# Patient Record
Sex: Male | Born: 1985 | Hispanic: No | Marital: Single | Smoking: Current some day smoker
Health system: Southern US, Community
[De-identification: ages and names within clinical notes are randomized; demographics above are authoritative.]

## PROBLEM LIST (undated history)

## (undated) DIAGNOSIS — E782 Mixed hyperlipidemia: Secondary | ICD-10-CM

## (undated) DIAGNOSIS — R109 Unspecified abdominal pain: Secondary | ICD-10-CM

## (undated) DIAGNOSIS — S83249A Other tear of medial meniscus, current injury, unspecified knee, initial encounter: Secondary | ICD-10-CM

## (undated) DIAGNOSIS — E119 Type 2 diabetes mellitus without complications: Secondary | ICD-10-CM

## (undated) HISTORY — DX: Type 2 diabetes mellitus without complications: E11.9

## (undated) HISTORY — DX: Unspecified abdominal pain: R10.9

## (undated) HISTORY — DX: Mixed hyperlipidemia: E78.2

---

## 2010-08-15 ENCOUNTER — Emergency Department (HOSPITAL_COMMUNITY): Admission: EM | Admit: 2010-08-15 | Discharge: 2010-08-16 | Payer: Self-pay | Admitting: Emergency Medicine

## 2010-08-17 ENCOUNTER — Emergency Department (HOSPITAL_COMMUNITY): Admission: EM | Admit: 2010-08-17 | Discharge: 2010-08-17 | Payer: Self-pay | Admitting: Emergency Medicine

## 2010-10-19 ENCOUNTER — Encounter
Admission: RE | Admit: 2010-10-19 | Discharge: 2010-12-14 | Payer: Self-pay | Source: Home / Self Care | Attending: Orthopedic Surgery | Admitting: Orthopedic Surgery

## 2011-03-12 LAB — COMPREHENSIVE METABOLIC PANEL
ALT: 35 U/L (ref 0–53)
Albumin: 3.9 g/dL (ref 3.5–5.2)
Alkaline Phosphatase: 97 U/L (ref 39–117)
BUN: 14 mg/dL (ref 6–23)
CO2: 22 mEq/L (ref 19–32)
Calcium: 8.2 mg/dL — ABNORMAL LOW (ref 8.4–10.5)
GFR calc Af Amer: >60 mL/min (ref >60)
GFR calc non Af Amer: >60 mL/min (ref >60)
Potassium: 3.5 mEq/L (ref 3.5–5.1)

## 2011-03-12 LAB — CBC
Hemoglobin: 14.9 g/dL (ref 13.0–17.0)
MCH: 30.5 pg (ref 26.0–34.0)
RBC: 4.89 MIL/uL (ref 4.22–5.81)
RDW: 13.5 % (ref 11.5–15.5)
WBC: 8.4 10*3/uL (ref 4.0–10.5)

## 2011-03-12 LAB — POCT I-STAT, CHEM 8
Creatinine, Ser: 0.8 mg/dL (ref 0.4–1.5)
Potassium: 3.5 mEq/L (ref 3.5–5.1)
Sodium: 141 mEq/L (ref 135–145)
TCO2: 23 mmol/L (ref 0–100)

## 2011-03-12 LAB — PROTIME-INR: Prothrombin Time: 13.1 seconds (ref 11.6–15.2)

## 2011-03-12 LAB — LACTIC ACID, PLASMA: Lactic Acid, Venous: 2.4 mmol/L — ABNORMAL HIGH (ref 0.5–2.2)

## 2011-03-12 LAB — APTT: aPTT: 27 seconds (ref 24–37)

## 2012-04-11 ENCOUNTER — Other Ambulatory Visit: Payer: Self-pay | Admitting: Orthopedic Surgery

## 2012-04-11 DIAGNOSIS — M25531 Pain in right wrist: Secondary | ICD-10-CM

## 2012-04-17 ENCOUNTER — Ambulatory Visit
Admission: RE | Admit: 2012-04-17 | Discharge: 2012-04-17 | Disposition: A | Discharge status: Home / Self Care | Payer: Self-pay | Source: Ambulatory Visit | Attending: Orthopedic Surgery | Admitting: Orthopedic Surgery

## 2012-04-17 DIAGNOSIS — M25531 Pain in right wrist: Secondary | ICD-10-CM

## 2012-04-17 MED ORDER — IOHEXOL 180 MG/ML  SOLN
3.0000 mL | Freq: Once | INTRAMUSCULAR | Status: AC | PRN
  Administered 2012-04-17: 3 mL via INTRA_ARTICULAR

## 2016-11-13 ENCOUNTER — Emergency Department (HOSPITAL_COMMUNITY): Payer: Medicaid Other

## 2016-11-13 ENCOUNTER — Observation Stay (HOSPITAL_COMMUNITY)
Admission: EM | Admit: 2016-11-13 | Discharge: 2016-11-15 | Disposition: A | Discharge status: Home / Self Care | Payer: Medicaid Other | Attending: Orthopaedic Surgery | Admitting: Orthopaedic Surgery

## 2016-11-13 ENCOUNTER — Encounter (HOSPITAL_COMMUNITY): Payer: Self-pay | Admitting: *Deleted

## 2016-11-13 DIAGNOSIS — K353 Acute appendicitis with localized peritonitis, without perforation or gangrene: Secondary | ICD-10-CM

## 2016-11-13 DIAGNOSIS — F17200 Nicotine dependence, unspecified, uncomplicated: Secondary | ICD-10-CM | POA: Insufficient documentation

## 2016-11-13 DIAGNOSIS — K37 Unspecified appendicitis: Secondary | ICD-10-CM | POA: Diagnosis present

## 2016-11-13 LAB — COMPREHENSIVE METABOLIC PANEL
ALT: 86 U/L — ABNORMAL HIGH (ref 17–63)
ANION GAP: 10 (ref 5–15)
AST: 49 U/L — ABNORMAL HIGH (ref 15–41)
Albumin: 4.9 g/dL (ref 3.5–5.0)
Alkaline Phosphatase: 65 U/L (ref 38–126)
BILIRUBIN TOTAL: 0.5 mg/dL (ref 0.3–1.2)
BUN: 15 mg/dL (ref 6–20)
CHLORIDE: 100 mmol/L — AB (ref 101–111)
CO2: 27 mmol/L (ref 22–32)
Calcium: 9.7 mg/dL (ref 8.9–10.3)
Creatinine, Ser: 0.9 mg/dL (ref 0.61–1.24)
Glucose, Bld: 133 mg/dL — ABNORMAL HIGH (ref 65–99)
POTASSIUM: 3.5 mmol/L (ref 3.5–5.1)
Sodium: 137 mmol/L (ref 135–145)
TOTAL PROTEIN: 7.9 g/dL (ref 6.5–8.1)

## 2016-11-13 LAB — CBC
HEMATOCRIT: 43.8 % (ref 39.0–52.0)
Hemoglobin: 15.6 g/dL (ref 13.0–17.0)
MCH: 29.8 pg (ref 26.0–34.0)
MCHC: 35.6 g/dL (ref 30.0–36.0)
MCV: 83.6 fL (ref 78.0–100.0)
Platelets: 305 10*3/uL (ref 150–400)
RBC: 5.24 MIL/uL (ref 4.22–5.81)
RDW: 13.3 % (ref 11.5–15.5)
WBC: 16.6 10*3/uL — AB (ref 4.0–10.5)

## 2016-11-13 LAB — URINALYSIS, ROUTINE W REFLEX MICROSCOPIC
Bilirubin Urine: NEGATIVE
GLUCOSE, UA: NEGATIVE mg/dL
Hgb urine dipstick: NEGATIVE
KETONES UR: NEGATIVE mg/dL
LEUKOCYTES UA: NEGATIVE
NITRITE: NEGATIVE
PH: 7 (ref 5.0–8.0)
PROTEIN: NEGATIVE mg/dL
Specific Gravity, Urine: 1.028 (ref 1.005–1.030)

## 2016-11-13 LAB — LIPASE, BLOOD: LIPASE: 22 U/L (ref 11–51)

## 2016-11-13 MED ORDER — ONDANSETRON HCL 4 MG/2ML IJ SOLN
4.0000 mg | Freq: Once | INTRAMUSCULAR | Status: AC
  Administered 2016-11-13: 4 mg via INTRAVENOUS
  Filled 2016-11-13: qty 2

## 2016-11-13 MED ORDER — HYDROMORPHONE HCL 2 MG/ML IJ SOLN
1.0000 mg | Freq: Once | INTRAMUSCULAR | Status: AC
  Administered 2016-11-13: 1 mg via INTRAVENOUS
  Filled 2016-11-13: qty 1

## 2016-11-13 MED ORDER — OXYCODONE-ACETAMINOPHEN 5-325 MG PO TABS
1.0000 | ORAL_TABLET | Freq: Once | ORAL | Status: AC
Start: 2016-11-13 — End: 2016-11-13
  Administered 2016-11-13: 1 via ORAL
  Filled 2016-11-13: qty 1

## 2016-11-13 MED ORDER — ONDANSETRON 4 MG PO TBDP
8.0000 mg | ORAL_TABLET | Freq: Once | ORAL | Status: AC
  Administered 2016-11-13: 8 mg via ORAL
  Filled 2016-11-13: qty 2

## 2016-11-13 MED ORDER — IOPAMIDOL (ISOVUE-300) INJECTION 61%
INTRAVENOUS | Status: AC
  Administered 2016-11-13: 100 mL
  Filled 2016-11-13: qty 100

## 2016-11-13 NOTE — ED Notes (Signed)
Patient transported to CT 

## 2016-11-13 NOTE — ED Triage Notes (Signed)
The pt has had abd pain since 1300 today with n and v  No blood in ujrine  He speaks only a little  English  No aocohol consumption

## 2016-11-13 NOTE — ED Provider Notes (Signed)
[facility]-EMERGENCY DEPT Provider Note   CSN: [id number] Arrival date & time: 11/13/16  1756     History   Chief Complaint Chief Complaint  Patient presents with  . Abdominal Pain    HPI William Sutton is a 30 y.o. male.  Patient presents to the emergency department with chief complaint of abdominal pain. He states that the pain started in his right lower quadrant earlier today and has spread to his entire abdomen. He reports associated nausea, vomiting, and the feeling like he needs to use the bathroom. He denies any dysuria or hematuria. Denies any upper abdominal pain, recent alcohol use, or prior abdominal surgeries. He denies any fevers or chills. He was given oxycodone in triage, but otherwise has not taken anything for symptoms. Symptoms are worsened with movement and palpation of the abdomen.   The history is provided by the patient. No language interpreter was used.    History reviewed. No pertinent past medical history.  There are no active problems to display for this patient.   History reviewed. No pertinent surgical history.     Home Medications    Prior to Admission medications   Not on File    Family History No family history on file.  Social History Social History  Substance Use Topics  . Smoking status: Current Every Day Smoker  . Smokeless tobacco: Never Used  . Alcohol use Yes     Allergies   Patient has no known allergies.   Review of Systems Review of Systems  Gastrointestinal: Positive for abdominal pain, nausea and vomiting.  All other systems reviewed and are negative.    Physical Exam Updated Vital Signs BP 126/95 (BP Location: Right Arm)   Pulse 76   Temp 97.7 F (36.5 C) (Oral)   Resp 18   Ht 5\' 2"  (1.575 m)   Wt 70.3 kg   SpO2 100%   BMI 28.35 kg/m   Physical Exam  Constitutional: He is oriented to person, place, and time. He appears well-developed and well-nourished.  HENT:  Head: Normocephalic and atraumatic.   Eyes: Conjunctivae and EOM are normal. Pupils are equal, round, and reactive to light. Right eye exhibits no discharge. Left eye exhibits no discharge. No scleral icterus.  Neck: Normal range of motion. Neck supple. No JVD present.  Cardiovascular: Normal rate, regular rhythm and normal heart sounds.  Exam reveals no gallop and no friction rub.   No murmur heard. Pulmonary/Chest: Effort normal and breath sounds normal. No respiratory distress. He has no wheezes. He has no rales. He exhibits no tenderness.  Abdominal: Soft. He exhibits no distension and no mass. There is tenderness. There is no rebound and no guarding.  Tenderness to palpation of the right lower quadrant  Musculoskeletal: Normal range of motion. He exhibits no edema or tenderness.  Neurological: He is alert and oriented to person, place, and time.  Skin: Skin is warm and dry.  Psychiatric: He has a normal mood and affect. His behavior is normal. Judgment and thought content normal.  Nursing note and vitals reviewed.    ED Treatments / Results  Labs (all labs ordered are listed, but only abnormal results are displayed) Labs Reviewed  COMPREHENSIVE METABOLIC PANEL - Abnormal; Notable for the following:       Result Value   Chloride 100 (*)    Glucose, Bld 133 (*)    AST 49 (*)    ALT 86 (*)    All other components within normal limits  CBC -  Abnormal; Notable for the following:    WBC 16.6 (*)    All other components within normal limits  LIPASE, BLOOD  URINALYSIS, ROUTINE W REFLEX MICROSCOPIC (NOT AT [facility])    EKG  EKG Interpretation None       Radiology No results found.  Procedures Procedures (including critical care time)  Medications Ordered in ED Medications  HYDROmorphone (DILAUDID) injection 1 mg (not administered)  ondansetron (ZOFRAN) injection 4 mg (not administered)  ondansetron (ZOFRAN-ODT) disintegrating tablet 8 mg (8 mg Oral Given 11/13/16 1843)  oxyCODONE-acetaminophen  (PERCOCET/ROXICET) 5-325 MG per tablet 1 tablet (1 tablet Oral Given 11/13/16 1844)     Initial Impression / Assessment and Plan / ED Course  I have reviewed the triage vital signs and the nursing notes.  Pertinent labs & imaging results that were available during my care of the patient were reviewed by me and considered in my medical decision making (see chart for details).  Clinical Course     Right lower quadrant abdominal pain with nausea and elevated white blood cell count. Will check CT abdomen. We'll treat pain. Will reassess.  CT is consistent with appendicitis given the patient's acute right lower quadrant symptoms.  Appreciate Dr. Dwain SarnaWakefield for consulting on the patient.  Will also consult social work regarding financial assistance for the patient.  Final Clinical Impressions(s) / ED Diagnoses   Final diagnoses:  Acute appendicitis with localized peritonitis    New Prescriptions New Prescriptions   No medications on file     Roxy HorsemanRobert Solymar Grace, PA-C 11/13/16 2307    Courteney Lyn Mackuen, MD 11/13/16 2333

## 2016-11-14 ENCOUNTER — Observation Stay (HOSPITAL_COMMUNITY): Payer: Medicaid Other | Admitting: Certified Registered Nurse Anesthetist

## 2016-11-14 ENCOUNTER — Encounter (HOSPITAL_COMMUNITY)
Admission: EM | Disposition: A | Discharge status: Home / Self Care | Payer: Self-pay | Source: Home / Self Care | Attending: Physician Assistant

## 2016-11-14 DIAGNOSIS — F17200 Nicotine dependence, unspecified, uncomplicated: Secondary | ICD-10-CM | POA: Diagnosis not present

## 2016-11-14 DIAGNOSIS — K37 Unspecified appendicitis: Secondary | ICD-10-CM | POA: Diagnosis present

## 2016-11-14 DIAGNOSIS — K353 Acute appendicitis with localized peritonitis: Secondary | ICD-10-CM | POA: Diagnosis not present

## 2016-11-14 HISTORY — PX: LAPAROSCOPIC APPENDECTOMY: SHX408

## 2016-11-14 LAB — SURGICAL PCR SCREEN
MRSA, PCR: NEGATIVE
STAPHYLOCOCCUS AUREUS: NEGATIVE

## 2016-11-14 SURGERY — APPENDECTOMY, LAPAROSCOPIC
Anesthesia: General | Site: Abdomen

## 2016-11-14 MED ORDER — PROMETHAZINE HCL 25 MG/ML IJ SOLN: 6.2500 mg | INTRAMUSCULAR | Status: DC | PRN

## 2016-11-14 MED ORDER — ONDANSETRON HCL 4 MG/2ML IJ SOLN
INTRAMUSCULAR | Status: AC
  Filled 2016-11-14: qty 2

## 2016-11-14 MED ORDER — SUGAMMADEX SODIUM 200 MG/2ML IV SOLN
INTRAVENOUS | Status: DC | PRN
  Administered 2016-11-14: 150 mg via INTRAVENOUS

## 2016-11-14 MED ORDER — ROCURONIUM BROMIDE 100 MG/10ML IV SOLN
INTRAVENOUS | Status: DC | PRN
  Administered 2016-11-14: 30 mg via INTRAVENOUS

## 2016-11-14 MED ORDER — PROPOFOL 10 MG/ML IV BOLUS
INTRAVENOUS | Status: AC
  Filled 2016-11-14: qty 40

## 2016-11-14 MED ORDER — FENTANYL CITRATE (PF) 100 MCG/2ML IJ SOLN
INTRAMUSCULAR | Status: DC | PRN
  Administered 2016-11-14 (×2): 100 ug via INTRAVENOUS
  Administered 2016-11-14: 50 ug via INTRAVENOUS

## 2016-11-14 MED ORDER — ACETAMINOPHEN 325 MG PO TABS: 650.0000 mg | ORAL_TABLET | Freq: Four times a day (QID) | ORAL | Status: DC | PRN

## 2016-11-14 MED ORDER — HYDROMORPHONE HCL 2 MG/ML IJ SOLN
1.0000 mg | INTRAMUSCULAR | Status: DC | PRN
  Administered 2016-11-14 (×2): 1 mg via INTRAVENOUS
  Filled 2016-11-14 (×2): qty 1

## 2016-11-14 MED ORDER — OXYCODONE-ACETAMINOPHEN 5-325 MG PO TABS
1.0000 | ORAL_TABLET | ORAL | Status: DC | PRN
  Administered 2016-11-14 – 2016-11-15 (×4): 2 via ORAL
  Filled 2016-11-14 (×4): qty 2

## 2016-11-14 MED ORDER — MIDAZOLAM HCL 5 MG/5ML IJ SOLN
INTRAMUSCULAR | Status: DC | PRN
  Administered 2016-11-14: 2 mg via INTRAVENOUS

## 2016-11-14 MED ORDER — SODIUM CHLORIDE 0.9 % IV SOLN
INTRAVENOUS | Status: DC
  Administered 2016-11-14 (×2): via INTRAVENOUS

## 2016-11-14 MED ORDER — ROCURONIUM BROMIDE 10 MG/ML (PF) SYRINGE
PREFILLED_SYRINGE | INTRAVENOUS | Status: AC
  Filled 2016-11-14: qty 10

## 2016-11-14 MED ORDER — PROPOFOL 10 MG/ML IV BOLUS
INTRAVENOUS | Status: DC | PRN
  Administered 2016-11-14: 150 mg via INTRAVENOUS

## 2016-11-14 MED ORDER — METRONIDAZOLE IN NACL 5-0.79 MG/ML-% IV SOLN
500.0000 mg | Freq: Three times a day (TID) | INTRAVENOUS | Status: DC
  Administered 2016-11-14 (×2): 500 mg via INTRAVENOUS
  Filled 2016-11-14 (×4): qty 100

## 2016-11-14 MED ORDER — EPHEDRINE 5 MG/ML INJ
INTRAVENOUS | Status: AC
  Filled 2016-11-14: qty 10

## 2016-11-14 MED ORDER — ONDANSETRON HCL 4 MG/2ML IJ SOLN: 4.0000 mg | Freq: Four times a day (QID) | INTRAMUSCULAR | Status: DC | PRN

## 2016-11-14 MED ORDER — DEXAMETHASONE SODIUM PHOSPHATE 10 MG/ML IJ SOLN
INTRAMUSCULAR | Status: AC
  Filled 2016-11-14: qty 1

## 2016-11-14 MED ORDER — LACTATED RINGERS IV SOLN
INTRAVENOUS | Status: DC
  Administered 2016-11-14 (×2): via INTRAVENOUS

## 2016-11-14 MED ORDER — LIDOCAINE 2% (20 MG/ML) 5 ML SYRINGE
INTRAMUSCULAR | Status: AC
  Filled 2016-11-14: qty 5

## 2016-11-14 MED ORDER — ONDANSETRON 4 MG PO TBDP: 4.0000 mg | ORAL_TABLET | Freq: Four times a day (QID) | ORAL | Status: DC | PRN

## 2016-11-14 MED ORDER — DEXTROSE 5 % IV SOLN
2.0000 g | INTRAVENOUS | Status: DC
  Administered 2016-11-14: 2 g via INTRAVENOUS
  Filled 2016-11-14: qty 2

## 2016-11-14 MED ORDER — 0.9 % SODIUM CHLORIDE (POUR BTL) OPTIME
TOPICAL | Status: DC | PRN
  Administered 2016-11-14: 1000 mL

## 2016-11-14 MED ORDER — SUGAMMADEX SODIUM 200 MG/2ML IV SOLN
INTRAVENOUS | Status: AC
  Filled 2016-11-14: qty 2

## 2016-11-14 MED ORDER — LIDOCAINE HCL (CARDIAC) 20 MG/ML IV SOLN
INTRAVENOUS | Status: DC | PRN
  Administered 2016-11-14: 60 mg via INTRAVENOUS

## 2016-11-14 MED ORDER — HYDROMORPHONE HCL 1 MG/ML IJ SOLN: 0.2500 mg | INTRAMUSCULAR | Status: DC | PRN

## 2016-11-14 MED ORDER — ENOXAPARIN SODIUM 40 MG/0.4ML ~~LOC~~ SOLN
40.0000 mg | SUBCUTANEOUS | Status: DC
  Administered 2016-11-15: 40 mg via SUBCUTANEOUS
  Filled 2016-11-14: qty 0.4

## 2016-11-14 MED ORDER — FENTANYL CITRATE (PF) 100 MCG/2ML IJ SOLN
INTRAMUSCULAR | Status: AC
  Filled 2016-11-14: qty 4

## 2016-11-14 MED ORDER — SUCCINYLCHOLINE CHLORIDE 200 MG/10ML IV SOSY
PREFILLED_SYRINGE | INTRAVENOUS | Status: AC
  Filled 2016-11-14: qty 10

## 2016-11-14 MED ORDER — KETOROLAC TROMETHAMINE 30 MG/ML IJ SOLN
INTRAMUSCULAR | Status: AC
  Filled 2016-11-14: qty 1

## 2016-11-14 MED ORDER — SODIUM CHLORIDE 0.9 % IR SOLN
Status: DC | PRN
  Administered 2016-11-14: 1

## 2016-11-14 MED ORDER — MIDAZOLAM HCL 2 MG/2ML IJ SOLN
INTRAMUSCULAR | Status: AC
  Filled 2016-11-14: qty 2

## 2016-11-14 MED ORDER — KETOROLAC TROMETHAMINE 30 MG/ML IJ SOLN: 30.0000 mg | Freq: Once | INTRAMUSCULAR | Status: DC | PRN

## 2016-11-14 MED ORDER — PHENYLEPHRINE 40 MCG/ML (10ML) SYRINGE FOR IV PUSH (FOR BLOOD PRESSURE SUPPORT)
PREFILLED_SYRINGE | INTRAVENOUS | Status: AC
  Filled 2016-11-14: qty 10

## 2016-11-14 MED ORDER — KETOROLAC TROMETHAMINE 30 MG/ML IJ SOLN
INTRAMUSCULAR | Status: DC | PRN
  Administered 2016-11-14: 30 mg via INTRAVENOUS

## 2016-11-14 MED ORDER — ONDANSETRON HCL 4 MG/2ML IJ SOLN
INTRAMUSCULAR | Status: DC | PRN
  Administered 2016-11-14: 4 mg via INTRAVENOUS

## 2016-11-14 MED ORDER — ACETAMINOPHEN 650 MG RE SUPP: 650.0000 mg | Freq: Four times a day (QID) | RECTAL | Status: DC | PRN

## 2016-11-14 MED ORDER — SUCCINYLCHOLINE CHLORIDE 20 MG/ML IJ SOLN
INTRAMUSCULAR | Status: DC | PRN
  Administered 2016-11-14: 100 mg via INTRAVENOUS

## 2016-11-14 MED ORDER — DEXAMETHASONE SODIUM PHOSPHATE 10 MG/ML IJ SOLN
INTRAMUSCULAR | Status: DC | PRN
  Administered 2016-11-14: 10 mg via INTRAVENOUS

## 2016-11-14 MED ORDER — BUPIVACAINE HCL (PF) 0.5 % IJ SOLN
INTRAMUSCULAR | Status: AC
Start: 2016-11-14 — End: 2016-11-14
  Filled 2016-11-14: qty 30

## 2016-11-14 SURGICAL SUPPLY — 35 items
APPLIER CLIP 5 13 M/L LIGAMAX5 (MISCELLANEOUS)
APPLIER CLIP ROT 10 11.4 M/L (STAPLE)
CANISTER SUCTION 2500CC (MISCELLANEOUS) ×3 IMPLANT
CHLORAPREP W/TINT 26ML (MISCELLANEOUS) ×3 IMPLANT
CLIP APPLIE 5 13 M/L LIGAMAX5 (MISCELLANEOUS) IMPLANT
CLIP APPLIE ROT 10 11.4 M/L (STAPLE) IMPLANT
COVER SURGICAL LIGHT HANDLE (MISCELLANEOUS) ×3 IMPLANT
CUTTER FLEX LINEAR 45M (STAPLE) IMPLANT
DERMABOND ADVANCED (GAUZE/BANDAGES/DRESSINGS) ×2
DERMABOND ADVANCED .7 DNX12 (GAUZE/BANDAGES/DRESSINGS) ×1 IMPLANT
ELECT REM PT RETURN 9FT ADLT (ELECTROSURGICAL) ×3
ELECTRODE REM PT RTRN 9FT ADLT (ELECTROSURGICAL) ×1 IMPLANT
GLOVE SURG SIGNA 7.5 PF LTX (GLOVE) ×3 IMPLANT
GOWN STRL REUS W/ TWL LRG LVL3 (GOWN DISPOSABLE) ×2 IMPLANT
GOWN STRL REUS W/ TWL XL LVL3 (GOWN DISPOSABLE) ×1 IMPLANT
GOWN STRL REUS W/TWL LRG LVL3 (GOWN DISPOSABLE) ×4
GOWN STRL REUS W/TWL XL LVL3 (GOWN DISPOSABLE) ×2
KIT BASIN OR (CUSTOM PROCEDURE TRAY) ×3 IMPLANT
KIT ROOM TURNOVER OR (KITS) ×3 IMPLANT
NS IRRIG 1000ML POUR BTL (IV SOLUTION) ×3 IMPLANT
PAD ARMBOARD 7.5X6 YLW CONV (MISCELLANEOUS) ×6 IMPLANT
POUCH SPECIMEN RETRIEVAL 10MM (ENDOMECHANICALS) ×3 IMPLANT
RELOAD 45 VASCULAR/THIN (ENDOMECHANICALS) IMPLANT
RELOAD STAPLE TA45 3.5 REG BLU (ENDOMECHANICALS) IMPLANT
SCALPEL HARMONIC ACE (MISCELLANEOUS) ×3 IMPLANT
SET IRRIG TUBING LAPAROSCOPIC (IRRIGATION / IRRIGATOR) ×3 IMPLANT
SLEEVE ENDOPATH XCEL 5M (ENDOMECHANICALS) ×3 IMPLANT
SPECIMEN JAR SMALL (MISCELLANEOUS) ×3 IMPLANT
SUT MON AB 4-0 PC3 18 (SUTURE) ×3 IMPLANT
TOWEL OR 17X24 6PK STRL BLUE (TOWEL DISPOSABLE) ×3 IMPLANT
TOWEL OR 17X26 10 PK STRL BLUE (TOWEL DISPOSABLE) ×3 IMPLANT
TRAY LAPAROSCOPIC MC (CUSTOM PROCEDURE TRAY) ×3 IMPLANT
TROCAR XCEL BLUNT TIP 100MML (ENDOMECHANICALS) ×3 IMPLANT
TROCAR XCEL NON-BLD 5MMX100MML (ENDOMECHANICALS) ×3 IMPLANT
TUBING INSUFFLATION (TUBING) ×3 IMPLANT

## 2016-11-14 NOTE — Progress Notes (Signed)
Transport here to take pt. To OR. Short stay called for report but no one's there yet.

## 2016-11-14 NOTE — Op Note (Signed)
Appendectomy, Lap, Procedure Note  Indications: The patient presented with a history of right-sided abdominal pain. A CT revealed findings consistent with acute appendicitis.  Pre-operative Diagnosis: acute appendicitis  Post-operative Diagnosis: Same  Surgeon: Abigail MiyamotoBLACKMAN,Lord Lancour A   Assistants: 0  Anesthesia: General endotracheal anesthesia  ASA Class: 1  Procedure Details  The patient was seen again in the Holding Room. The risks, benefits, complications, treatment options, and expected outcomes were discussed with the patient and/or family. The possibilities of reaction to medication, perforation of viscus, bleeding, recurrent infection, finding a normal appendix, the need for additional procedures, failure to diagnose a condition, and creating a complication requiring transfusion or operation were discussed. There was concurrence with the proposed plan and informed consent was obtained. The site of surgery was properly noted. The patient was taken to Operating Room, identified as William Sutton and the procedure verified as Appendectomy. A Time Out was held and the above information confirmed.  The patient was placed in the supine position and general anesthesia was induced, along with placement of orogastric tube, Venodyne boots, and a Foley catheter. The abdomen was prepped and draped in a sterile fashion. A one centimeter infraumbilical incision was made.  The midline fascia was incised with a #15 blade.  A Kelly clamp was used to confirm entrance into the peritoneal cavity.  A pursestring suture was passed around the incision with a 0 Vicryl.  The Hasson was introduced into the abdomen and the tails of the suture were used to hold the Hasson in place.   The pneumoperitoneum was then established to steady pressure of 15 mmHg.  Additional 5 mm cannulas then placed in the left lower quadrant of the abdomen and the right upper quadrant region under direct visualization. A careful  evaluation of the entire abdomen was carried out. The patient was placed in Trendelenburg and left lateral decubitus position. The small intestines were retracted in the cephalad and left lateral direction away from the pelvis and right lower quadrant. The patient was found to have an enlarged and inflamed appendix that was extending into the pelvis. There was no evidence of perforation.  The appendix was carefully dissected. The appendix was was skeletonized with the harmonic scalpel.   The appendix was divided at its base using an endo-GIA stapler. Minimal appendiceal stump was left in place. There was no evidence of bleeding, leakage, or complication after division of the appendix. Irrigation was also performed and irrigate suctioned from the abdomen as well.  The umbilical port site was closed with the purse string suture. There was no residual palpable fascial defect.  The trocar site skin wounds were closed with 4-0 Monocryl.  Instrument, sponge, and needle counts were correct at the conclusion of the case.   Findings: The appendix was found to be inflamed. There were not signs of necrosis.  There was not perforation. There was not abscess formation.  Estimated Blood Loss:  Minimal         Drains:none         Complications:  None; patient tolerated the procedure well.         Disposition: PACU - hemodynamically stable.         Condition: stable

## 2016-11-14 NOTE — Progress Notes (Signed)
Patient ID: William Sutton, male   DOB: 02/28/1986, 30 y.o.   MRN: [id number]   Patient with acute appendicitis  Plan appendectomy today.  The risks have been discussed with the patient and he agrees to proceed.

## 2016-11-14 NOTE — Anesthesia Procedure Notes (Signed)
Procedure Name: Intubation Date/Time: 11/14/2016 9:20 AM Performed by: Roney MansSMITH, Loyce Flaming P Pre-anesthesia Checklist: Patient identified, Emergency Drugs available, Suction available, Patient being monitored and Timeout performed Patient Re-evaluated:Patient Re-evaluated prior to inductionOxygen Delivery Method: Circle system utilized Preoxygenation: Pre-oxygenation with 100% oxygen Intubation Type: IV induction, Rapid sequence and Cricoid Pressure applied Ventilation: Mask ventilation without difficulty Laryngoscope Size: Mac and 3 Grade View: Grade I Tube type: Oral Tube size: 7.5 mm Number of attempts: 1 Airway Equipment and Method: Stylet Placement Confirmation: ETT inserted through vocal cords under direct vision,  positive ETCO2 and breath sounds checked- equal and bilateral Secured at: 21 cm Tube secured with: Tape Dental Injury: Teeth and Oropharynx as per pre-operative assessment

## 2016-11-14 NOTE — Transfer of Care (Signed)
Immediate Anesthesia Transfer of Care Note  Patient: William Sutton  Procedure(s) Performed: Procedure(s): APPENDECTOMY LAPAROSCOPIC (N/A)  Patient Location: PACU  Anesthesia Type:General  Level of Consciousness: awake, alert  and patient cooperative  Airway & Oxygen Therapy: Patient Spontanous Breathing and Patient connected to nasal cannula oxygen  Post-op Assessment: Report given to RN, Post -op Vital signs reviewed and stable and Patient moving all extremities  Post vital signs: Reviewed and stable  Last Vitals:  Vitals:   11/14/16 0107 11/14/16 0603  BP: (!) 96/55 (!) 99/54  Pulse: 87 73  Resp: 15 17  Temp: 37.1 C 36.8 C    Last Pain:  Vitals:   11/14/16 0603  TempSrc: Oral  PainSc:       Patients Stated Pain Goal: 1 (11/14/16 0113)  Complications: No apparent anesthesia complications

## 2016-11-14 NOTE — H&P (Signed)
William Sutton is an 30 y.o. male.   Chief Complaint: abd pain HPI: 37 yom with <24 hours of rlq pain.  Has some n/v, feels like he has to urinate. No urinary symptoms.  No fevers.  Not hungry, had ct scan that shows likely appendicitis   History reviewed. No pertinent past medical history.  History reviewed. No pertinent surgical history.  No family history on file. Social History:  reports that he has been smoking.  He has never used smokeless tobacco. He reports that he drinks alcohol. His drug history is not on file.  Allergies: No Known Allergies  meds none  Results for orders placed or performed during the hospital encounter of 11/13/16 (from the past 48 hour(s))  Lipase, blood     Status: None   Collection Time: 11/13/16  6:19 PM  Result Value Ref Range   Lipase 22 11 - 51 U/L  Comprehensive metabolic panel     Status: Abnormal   Collection Time: 11/13/16  6:19 PM  Result Value Ref Range   Sodium 137 135 - 145 mmol/L   Potassium 3.5 3.5 - 5.1 mmol/L   Chloride 100 (L) 101 - 111 mmol/L   CO2 27 22 - 32 mmol/L   Glucose, Bld 133 (H) 65 - 99 mg/dL   BUN 15 6 - 20 mg/dL   Creatinine, Ser 0.90 0.61 - 1.24 mg/dL   Calcium 9.7 8.9 - 10.3 mg/dL   Total Protein 7.9 6.5 - 8.1 g/dL   Albumin 4.9 3.5 - 5.0 g/dL   AST 49 (H) 15 - 41 U/L   ALT 86 (H) 17 - 63 U/L   Alkaline Phosphatase 65 38 - 126 U/L   Total Bilirubin 0.5 0.3 - 1.2 mg/dL   GFR calc non Af Amer >60 >60 mL/min   GFR calc Af Amer >60 >60 mL/min    Comment: (NOTE) The eGFR has been calculated using the CKD EPI equation. This calculation has not been validated in all clinical situations. eGFR's persistently <60 mL/min signify possible Chronic Kidney Disease.    Anion gap 10 5 - 15  CBC     Status: Abnormal   Collection Time: 11/13/16  6:19 PM  Result Value Ref Range   WBC 16.6 (H) 4.0 - 10.5 K/uL   RBC 5.24 4.22 - 5.81 MIL/uL   Hemoglobin 15.6 13.0 - 17.0 g/dL   HCT 43.8 39.0 - 52.0 %   MCV 83.6 78.0 -  100.0 fL   MCH 29.8 26.0 - 34.0 pg   MCHC 35.6 30.0 - 36.0 g/dL   RDW 13.3 11.5 - 15.5 %   Platelets 305 150 - 400 K/uL  Urinalysis, Routine w reflex microscopic     Status: None   Collection Time: 11/13/16  6:51 PM  Result Value Ref Range   Color, Urine YELLOW YELLOW   APPearance CLEAR CLEAR   Specific Gravity, Urine 1.028 1.005 - 1.030   pH 7.0 5.0 - 8.0   Glucose, UA NEGATIVE NEGATIVE mg/dL   Hgb urine dipstick NEGATIVE NEGATIVE   Bilirubin Urine NEGATIVE NEGATIVE   Ketones, ur NEGATIVE NEGATIVE mg/dL   Protein, ur NEGATIVE NEGATIVE mg/dL   Nitrite NEGATIVE NEGATIVE   Leukocytes, UA NEGATIVE NEGATIVE    Comment: MICROSCOPIC NOT DONE ON URINES WITH NEGATIVE PROTEIN, BLOOD, LEUKOCYTES, NITRITE, OR GLUCOSE <1000 mg/dL.   Ct Abdomen Pelvis W Contrast  Result Date: 11/13/2016 CLINICAL DATA:  Right lower quadrant pain. EXAM: CT ABDOMEN AND PELVIS WITH CONTRAST TECHNIQUE: Multidetector CT  imaging of the abdomen and pelvis was performed using the standard protocol following bolus administration of intravenous contrast. CONTRAST:  1 ISOVUE-300 IOPAMIDOL (ISOVUE-300) INJECTION 61% COMPARISON:  August 15, 2010 FINDINGS: Lower chest: No acute abnormality. Hepatobiliary: Hepatic steatosis. The gallbladder and portal vein are normal. Pancreas: Unremarkable. No pancreatic ductal dilatation or surrounding inflammatory changes. Spleen: Normal in size without focal abnormality. Adrenals/Urinary Tract: A left renal cyst is identified. No suspicious renal masses, hydronephrosis, or perinephric stranding. Stomach/Bowel: The stomach and small bowel are normal. The colon is unremarkable. The appendix is dilated and fluid-filled with mucosal enhancement, particularly distally. Minimal para appendiceal stranding identified. Vascular/Lymphatic: No significant vascular findings are present. No enlarged abdominal or pelvic lymph nodes. Reproductive: Prostate is unremarkable. Other: No abdominal wall hernia or  abnormality. No abdominopelvic ascites. Musculoskeletal: No acute or significant osseous findings. IMPRESSION: 1. Dilated fluid-filled appendix with mild mucosal enhancement and minimal periappendiceal stranding is consistent with acute appendicitis given the patient's symptoms. In an asymptomatic patient, a mucocele could have a similar appearance. Findings called to the patient's PA, William Sutton Electronically Signed   By: Dorise Bullion III M.D   On: 11/13/2016 21:39    Review of Systems  Constitutional: Negative for chills and fever.  Respiratory: Negative for cough and shortness of breath.   Cardiovascular: Negative for chest pain.  Gastrointestinal: Positive for abdominal pain and vomiting.  Genitourinary: Negative for dysuria and urgency.    Blood pressure 118/79, pulse 102, temperature 97.7 F (36.5 C), temperature source Oral, resp. rate 18, height 5' 2"  (1.575 m), weight 70.3 kg (155 lb), SpO2 96 %. Physical Exam  Vitals reviewed. Constitutional: He is oriented to person, place, and time. He appears well-developed and well-nourished.  HENT:  Head: Normocephalic and atraumatic.  Eyes: No scleral icterus.  Cardiovascular: Normal rate and regular rhythm.   Respiratory: Effort normal and breath sounds normal.  GI: Soft. There is tenderness in the right lower quadrant.  Lymphadenopathy:    He has no cervical adenopathy.  Neurological: He is alert and oriented to person, place, and time.     Assessment/Plan Appendicitis  Discussed via family member as patient was fine with that.  Will admit, iv fluids, iv abx, npo and plan for lap appy in am tomorrw  Rolm Bookbinder, MD 11/14/2016, 12:07 AM

## 2016-11-14 NOTE — Anesthesia Preprocedure Evaluation (Signed)
Anesthesia Evaluation  Patient identified by MRN, date of birth, ID band Patient awake    Reviewed: Allergy & Precautions, NPO status , Patient's Chart, lab work & pertinent test results  Airway Mallampati: II  TM Distance: >3 FB Neck ROM: Full    Dental no notable dental hx.    Pulmonary Current Smoker,    Pulmonary exam normal breath sounds clear to auscultation       Cardiovascular negative cardio ROS Normal cardiovascular exam Rhythm:Regular Rate:Normal     Neuro/Psych negative neurological ROS  negative psych ROS   GI/Hepatic negative GI ROS, Neg liver ROS,   Endo/Other  negative endocrine ROS  Renal/GU negative Renal ROS  negative genitourinary   Musculoskeletal negative musculoskeletal ROS (+)   Abdominal   Peds negative pediatric ROS (+)  Hematology negative hematology ROS (+)   Anesthesia Other Findings   Reproductive/Obstetrics negative OB ROS                             Anesthesia Physical Anesthesia Plan  ASA: II  Anesthesia Plan: General   Post-op Pain Management:    Induction: Intravenous  Airway Management Planned: Oral ETT  Additional Equipment:   Intra-op Plan:   Post-operative Plan: Extubation in OR  Informed Consent: I have reviewed the patients History and Physical, chart, labs and discussed the procedure including the risks, benefits and alternatives for the proposed anesthesia with the patient or authorized representative who has indicated his/her understanding and acceptance.   Dental advisory given  Plan Discussed with: CRNA and Surgeon  Anesthesia Plan Comments:         Anesthesia Quick Evaluation  

## 2016-11-14 NOTE — Anesthesia Postprocedure Evaluation (Signed)
Anesthesia Post Note  Patient: William Sutton  Procedure(s) Performed: Procedure(s) (LRB): APPENDECTOMY LAPAROSCOPIC (N/A)  Patient location during evaluation: PACU Anesthesia Type: General Level of consciousness: awake and alert Pain management: pain level controlled Vital Signs Assessment: post-procedure vital signs reviewed and stable Respiratory status: spontaneous breathing, nonlabored ventilation, respiratory function stable and patient connected to nasal cannula oxygen Cardiovascular status: blood pressure returned to baseline and stable Postop Assessment: no signs of nausea or vomiting Anesthetic complications: no    Last Vitals:  Vitals:   11/14/16 1001 11/14/16 1016  BP: 107/70 96/64  Pulse: 94 80  Resp: 13 11  Temp:      Last Pain:  Vitals:   11/14/16 0603  TempSrc: Oral  PainSc:                  Andy Moye S

## 2016-11-15 ENCOUNTER — Encounter (HOSPITAL_COMMUNITY): Payer: Self-pay | Admitting: Surgery

## 2016-11-15 MED ORDER — OXYCODONE-ACETAMINOPHEN 5-325 MG PO TABS: 1.0000 | ORAL_TABLET | ORAL | 0 refills | Status: DC | PRN

## 2016-11-15 NOTE — Discharge Instructions (Signed)
CIRUGIA LAPAROSCOPICA: INSTRUCCIONES DE POST OPERATORIO. ° °Revise siempre los documentos que le entreguen en el lugar donde se ha hecho la cirugia. ° °SI USTED NECESITA DOCUMENTOS DE INCAPACIDAD (DISABLE) O DE PERMISO FAMILAR (FAMILY LEAVE) NECESITA TRAERLOS A LA OFICINA PARA QUE SEAN PROCESADOS. °NO  SE LOS DE A SU DOCTOR. °1. A su alta del hospital se le dara una receta para controlar el dolor. Tomela como ha sido recetada, si la necesita. Si no la necesita puede tomar, Acetaminofen (Tylenol) o Ibuprofen (Advil) para aliviar dolor moderado. °2. Continue tomando el resto de sus medicinas. °3. Si necesita rellenar la receta, llame a la farmacia. ellos contactan a nuestra oficina pidiendo autorizacion. Este tipo de receta no pueden ser rellenadas despues de las  5pm o durante los fines de semana. °4. Con relacion a la dieta: debe ser ligera los primeros dias despues que llege a la casa. Ejemplo: sopas y galleticas. Tome bastante liquido esos dias. °5. La mayoria de los pacientes padecen de inflamacion y cambio de coloracion de la piel alrededor de las incisiones. esto toma dias en resolver.  pnerse una bolsa de hielo en el area affectada ayuda..  °6. Es comun tambien tener un poco de estrenimiento si esta tomado medicinas para el dolor. incremente la cantidad de liquidos a tomar y puede tomar (Colace) esto previene el problema. Si ya tiene estrenimiento, es decir no ha defecado en 48 horas, puede tomar un laxativo (Milk of Magnesia or Miralax) uselo como el paquete le explica. °7.  A menos que se le diga algo diferente. Remueva el bendaje a las 24-48 horas despues dela cirugia. y puede banarse en la ducha sin ningun problema. usted puede tener steri-strips (pequenas curitas transparentes en la piel puesta encima de la incision)  Estas banditas strips should be left on the skin for 7-10 days.   Si su cirujano puso pegamento encima de la incision usted puede banarse bajo la ducha en 24 horas. Este pegamento empezara a  caerse en las proximas 2-3 semanas. Si le pusieron suturas o presillas (grapos) estos seran quitados en su proxima cita en la oficina. . °a. ACTIVIDADES:  Puede hacer actividad ligera.  Como caminar , subir escaleras y poco a poco irlas incrementando tanto como las tolere. Puede tener relaciones sexuales cuando sea comfortable. No carge objetos pesados o haga esfuerzos que no sean aprovados por su doctor. °b. Puede manejar en cuanto no esta tomando medicamentos fuertes (narcoticos) para el dolor, pueda abrochar confortablemente el cinturon de seguridad, y pueda maniobrar y usar los pedales de su vehiculo con seguridad. °c. PUEDE REGRESAR A TRABAJAR  °8. Debe ver a su doctor para una cita de seguimiento en 2-3 semanas despues de la cirugia.  °9. OTRAS ISNSTRUCCIONES:___________________________________________________________________________________ °CUANDO LLAMAR A SU MEDICO: °1. FIEBRE mayor de  101.0 °2. No produccion de orina. °3. Sangramiento continue de la herida °4. Incremento de dolor, enrojecimientio o drenaje de la herida (incision) °5. Incremento de dolor abdominal. ° °The clinic staff is available to answer your questions during regular business hours.  Please don’t hesitate to call and ask to speak to one of the nurses for clinical concerns.  If you have a medical emergency, go to the nearest emergency room or call 911.  A surgeon from [facility] is always on call at the hospital. °[address], [address], [place], [place]  [address] ? [address] [address], [place], [place]   [address] °[phone] ? [phone] ? FAX [phone] °Web site: [email or web address] ° ° °

## 2016-11-15 NOTE — Progress Notes (Signed)
Berel Ramos-Perez to be D/C'd  per MD order. Discussed with the patient and all questions fully answered.  VSS, Skin clean, dry and intact without evidence of skin break down, no evidence of skin tears noted.  IV catheter discontinued intact. Site without signs and symptoms of complications. Dressing and pressure applied.  An After Visit Summary was printed and given to the patient. Patient received prescription.  D/c education completed with patient/family including follow up instructions, medication list, d/c activities limitations if indicated, with other d/c instructions as indicated by MD - patient able to verbalize understanding, all questions fully answered.   Patient instructed to return to ED, call 911, or call MD for any changes in condition.   Patient to be escorted via WC, and D/C home via private auto.

## 2016-11-15 NOTE — Discharge Summary (Signed)
[facility] Discharge Summary   Patient ID: William Sutton MRN: [id number] DOB/AGE: 29/23/1987 29 y.o.  Admit date: 11/13/2016 Discharge date: 11/15/2016  Admitting Diagnosis: Acute appendicitis  Discharge Diagnosis Patient Active Problem List   Diagnosis Date Noted  . Appendicitis 11/14/2016    Consultants None  Imaging: Ct Abdomen Pelvis W Contrast  Result Date: 11/13/2016 CLINICAL DATA:  Right lower quadrant pain. EXAM: CT ABDOMEN AND PELVIS WITH CONTRAST TECHNIQUE: Multidetector CT imaging of the abdomen and pelvis was performed using the standard protocol following bolus administration of intravenous contrast. CONTRAST:  1 ISOVUE-300 IOPAMIDOL (ISOVUE-300) INJECTION 61% COMPARISON:  August 15, 2010 FINDINGS: Lower chest: No acute abnormality. Hepatobiliary: Hepatic steatosis. The gallbladder and portal vein are normal. Pancreas: Unremarkable. No pancreatic ductal dilatation or surrounding inflammatory changes. Spleen: Normal in size without focal abnormality. Adrenals/Urinary Tract: A left renal cyst is identified. No suspicious renal masses, hydronephrosis, or perinephric stranding. Stomach/Bowel: The stomach and small bowel are normal. The colon is unremarkable. The appendix is dilated and fluid-filled with mucosal enhancement, particularly distally. Minimal para appendiceal stranding identified. Vascular/Lymphatic: No significant vascular findings are present. No enlarged abdominal or pelvic lymph nodes. Reproductive: Prostate is unremarkable. Other: No abdominal wall hernia or abnormality. No abdominopelvic ascites. Musculoskeletal: No acute or significant osseous findings. IMPRESSION: 1. Dilated fluid-filled appendix with mild mucosal enhancement and minimal periappendiceal stranding is consistent with acute appendicitis given the patient's symptoms. In an asymptomatic patient, a mucocele could have a similar appearance. Findings called to the patient's PA, [name] Electronically Signed   By: Gerome Samavid  Williams III M.D   On: 11/13/2016 21:39    Procedures Dr. Magnus IvanBlackman (11/15/16) - Laparoscopic Appendectomy  Hospital Course:  William Sutton is a 30yo male [facility] with acute onset RLQ abdominal pain.  Workup showed acute appendicitis.  Patient was admitted and underwent procedure listed above.  Tolerated procedure well and was transferred to the floor.  Diet was advanced as tolerated.  On POD1 the patient was voiding well, tolerating diet, ambulating well, pain well controlled, vital signs stable, incisions c/d/i and felt stable for discharge home.  Patient will follow up in our office in 2-3 weeks and knows to call with questions or concerns.  He will call to confirm appointment date/time.    Physical Exam: Gen:  Alert, NAD, pleasant Card:  RRR Pulm:  CTAB Abd: Soft, ND, appropriately tender, +BS, incisions C/D/I    Medication List    TAKE these medications   oxyCODONE-acetaminophen 5-325 MG tablet Commonly known as:  PERCOCET/ROXICET Take 1-2 tablets by mouth every 4 (four) hours as needed for moderate pain.        Follow-up Information    [facility] [place].   Specialty:  General Surgery Why:  Our office should call you in 24-48 hours with an appointment. If you do not hear from us please call the number above. We would like to see you back in about 2-3 weeks. Contact information: [address] [place] [id number] [phone]          Signed: Edson SnowballBROOKE A MILLER, [facility] 11/15/2016, 3:40 PM Pager: [phone] Consults: [phone] Mon-Fri 7:00 am-4:30 pm Sat-Sun 7:00 am-11:30 am

## 2016-11-15 NOTE — Progress Notes (Signed)
Patient ID: William Sutton, male   DOB: 05/20/1986, 30 y.o.   MRN: [id number]  [facility]  1 Day Post-Op  Subjective: Abdomen still sore but feeling better than prior to admission. Denies n/v. Tolerating diet. Has ambulated with no issues. Passing flatus.  Objective: Vital signs in last 24 hours: Temp:  [97.8 F (36.6 C)-98.4 F (36.9 C)] 97.8 F (36.6 C) (11/20 0426) Pulse Rate:  [78-83] 78 (11/19 1900) Resp:  [19] 19 (11/20 0426) BP: (95-100)/(55-60) 100/60 (11/20 0426) SpO2:  [95 %-98 %] 98 % (11/20 0426) Last BM Date: 11/13/16  Intake/Output from previous day: 11/19 0701 - 11/20 0700 In: 2040 [P.O.:240; I.V.:1800] Out: 485 [Urine:475; Blood:10] Intake/Output this shift: No intake/output data recorded.  PE: Gen:  Alert, NAD, pleasant Card:  RRR Pulm:  CTAB Abd: Soft, ND, appropriately tender, +BS, incisions C/D/I  Lab Results:   Recent Labs  11/13/16 1819  WBC 16.6*  HGB 15.6  HCT 43.8  PLT 305   BMET  Recent Labs  11/13/16 1819  NA 137  K 3.5  CL 100*  CO2 27  GLUCOSE 133*  BUN 15  CREATININE 0.90  CALCIUM 9.7   PT/INR No results for input(s): LABPROT, INR in the last 72 hours. CMP     Component Value Date/Time   NA 137 11/13/2016 1819   K 3.5 11/13/2016 1819   CL 100 (L) 11/13/2016 1819   CO2 27 11/13/2016 1819   GLUCOSE 133 (H) 11/13/2016 1819   BUN 15 11/13/2016 1819   CREATININE 0.90 11/13/2016 1819   CALCIUM 9.7 11/13/2016 1819   PROT 7.9 11/13/2016 1819   ALBUMIN 4.9 11/13/2016 1819   AST 49 (H) 11/13/2016 1819   ALT 86 (H) 11/13/2016 1819   ALKPHOS 65 11/13/2016 1819   BILITOT 0.5 11/13/2016 1819   GFRNONAA >60 11/13/2016 1819   GFRAA >60 11/13/2016 1819   Lipase     Component Value Date/Time   LIPASE 22 11/13/2016 1819       Studies/Results: Ct Abdomen Pelvis W Contrast  Result Date: 11/13/2016 CLINICAL DATA:  Right lower quadrant pain. EXAM: CT ABDOMEN AND PELVIS WITH CONTRAST  TECHNIQUE: Multidetector CT imaging of the abdomen and pelvis was performed using the standard protocol following bolus administration of intravenous contrast. CONTRAST:  1 ISOVUE-300 IOPAMIDOL (ISOVUE-300) INJECTION 61% COMPARISON:  August 15, 2010 FINDINGS: Lower chest: No acute abnormality. Hepatobiliary: Hepatic steatosis. The gallbladder and portal vein are normal. Pancreas: Unremarkable. No pancreatic ductal dilatation or surrounding inflammatory changes. Spleen: Normal in size without focal abnormality. Adrenals/Urinary Tract: A left renal cyst is identified. No suspicious renal masses, hydronephrosis, or perinephric stranding. Stomach/Bowel: The stomach and small bowel are normal. The colon is unremarkable. The appendix is dilated and fluid-filled with mucosal enhancement, particularly distally. Minimal para appendiceal stranding identified. Vascular/Lymphatic: No significant vascular findings are present. No enlarged abdominal or pelvic lymph nodes. Reproductive: Prostate is unremarkable. Other: No abdominal wall hernia or abnormality. No abdominopelvic ascites. Musculoskeletal: No acute or significant osseous findings. IMPRESSION: 1. Dilated fluid-filled appendix with mild mucosal enhancement and minimal periappendiceal stranding is consistent with acute appendicitis given the patient's symptoms. In an asymptomatic patient, a mucocele could have a similar appearance. Findings called to the patient's PA, Roxy Horsemanobert Browning Electronically Signed   By: Gerome Samavid  Williams III M.D   On: 11/13/2016 21:39    Anti-infectives: Anti-infectives    Start     Dose/Rate Route Frequency Ordered Stop   11/14/16 0200  cefTRIAXone (ROCEPHIN) 2  g in dextrose 5 % 50 mL IVPB  Status:  Discontinued     2 g 100 mL/hr over 30 Minutes Intravenous Every 24 hours 11/14/16 0124 11/14/16 1121   11/14/16 0130  metroNIDAZOLE (FLAGYL) IVPB 500 mg  Status:  Discontinued     500 mg 100 mL/hr over 60 Minutes Intravenous Every 8 hours  11/14/16 0124 11/14/16 1121       Assessment/Plan S/p laparoscopic appendectomy 11/19 Dr. Magnus IvanBlackman - POD 1 - pain controlled, tolerating diet, ready for discharge  Plan - discharging patient today. He will follow-up in our [facility] clinic in 2-3 weeks. Postop instructions reviewed and questions answered with interpretor. He was given a work note and rx for pain medication.   LOS: 0 days    Edson SnowballBROOKE A MILLER , [facility] 11/15/2016, 11:49 AM Pager: [phone] Consults: [phone] Mon-Fri 7:00 am-4:30 pm Sat-Sun 7:00 am-11:30 am

## 2018-06-02 ENCOUNTER — Ambulatory Visit
Admission: RE | Admit: 2018-06-02 | Discharge: 2018-06-02 | Disposition: A | Discharge status: Home / Self Care | Payer: No Typology Code available for payment source | Source: Ambulatory Visit | Attending: Nurse Practitioner | Admitting: Nurse Practitioner

## 2018-06-02 ENCOUNTER — Other Ambulatory Visit: Payer: Self-pay | Admitting: Nurse Practitioner

## 2018-06-02 DIAGNOSIS — M25562 Pain in left knee: Secondary | ICD-10-CM

## 2019-01-29 IMAGING — DX DG KNEE COMPLETE 4+V*L*
4 series · 4 of 4 positions shown · non-contrast
Comparison: LEFT tibial and fibular radiographs 08/15/2010

CLINICAL DATA: LEFT knee pain for 1 month, anteriorly radiating
laterally than posteriorly, injured while working, history diabetes
mellitus

EXAM:
LEFT KNEE - COMPLETE 4+ VIEW

[dg knee complete 4 views left (1 of 4)]
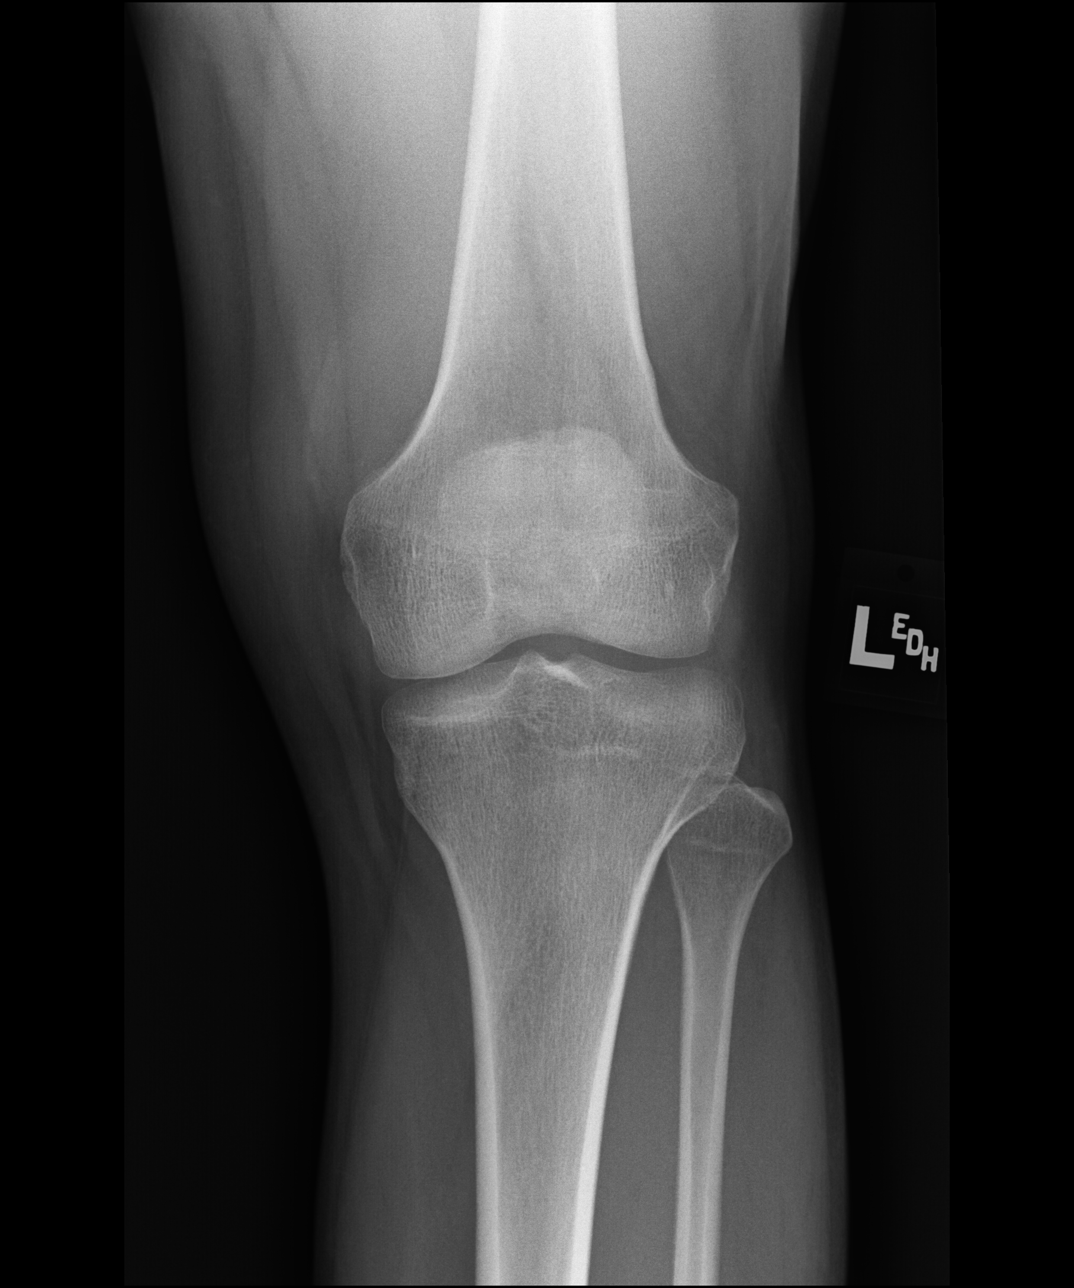

[dg knee complete 4 views left (2 of 4)]
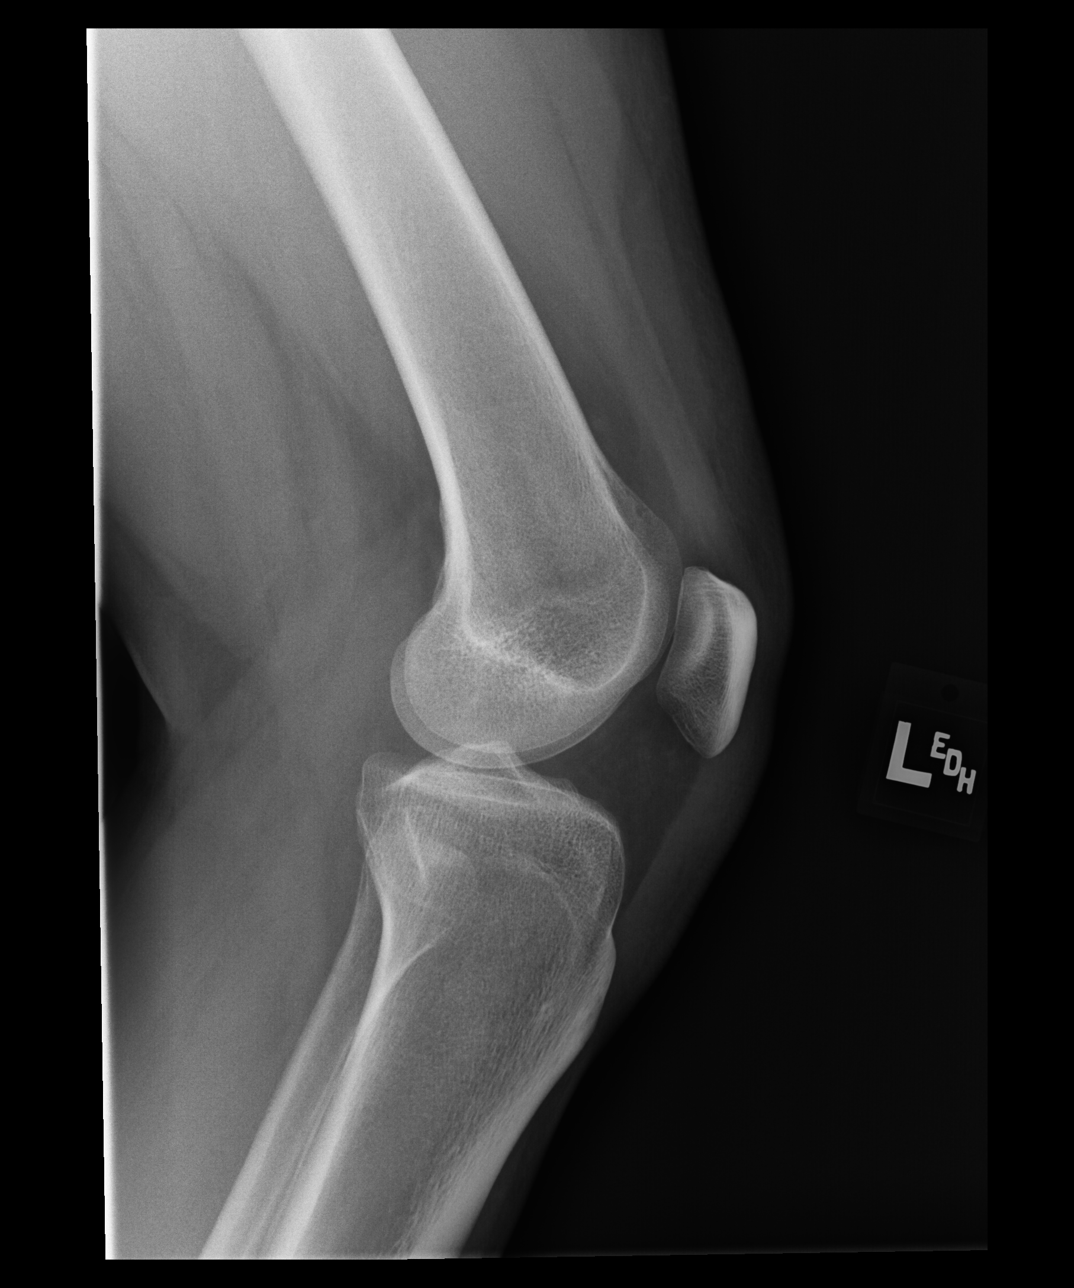

[dg knee complete 4 views left (3 of 4)]
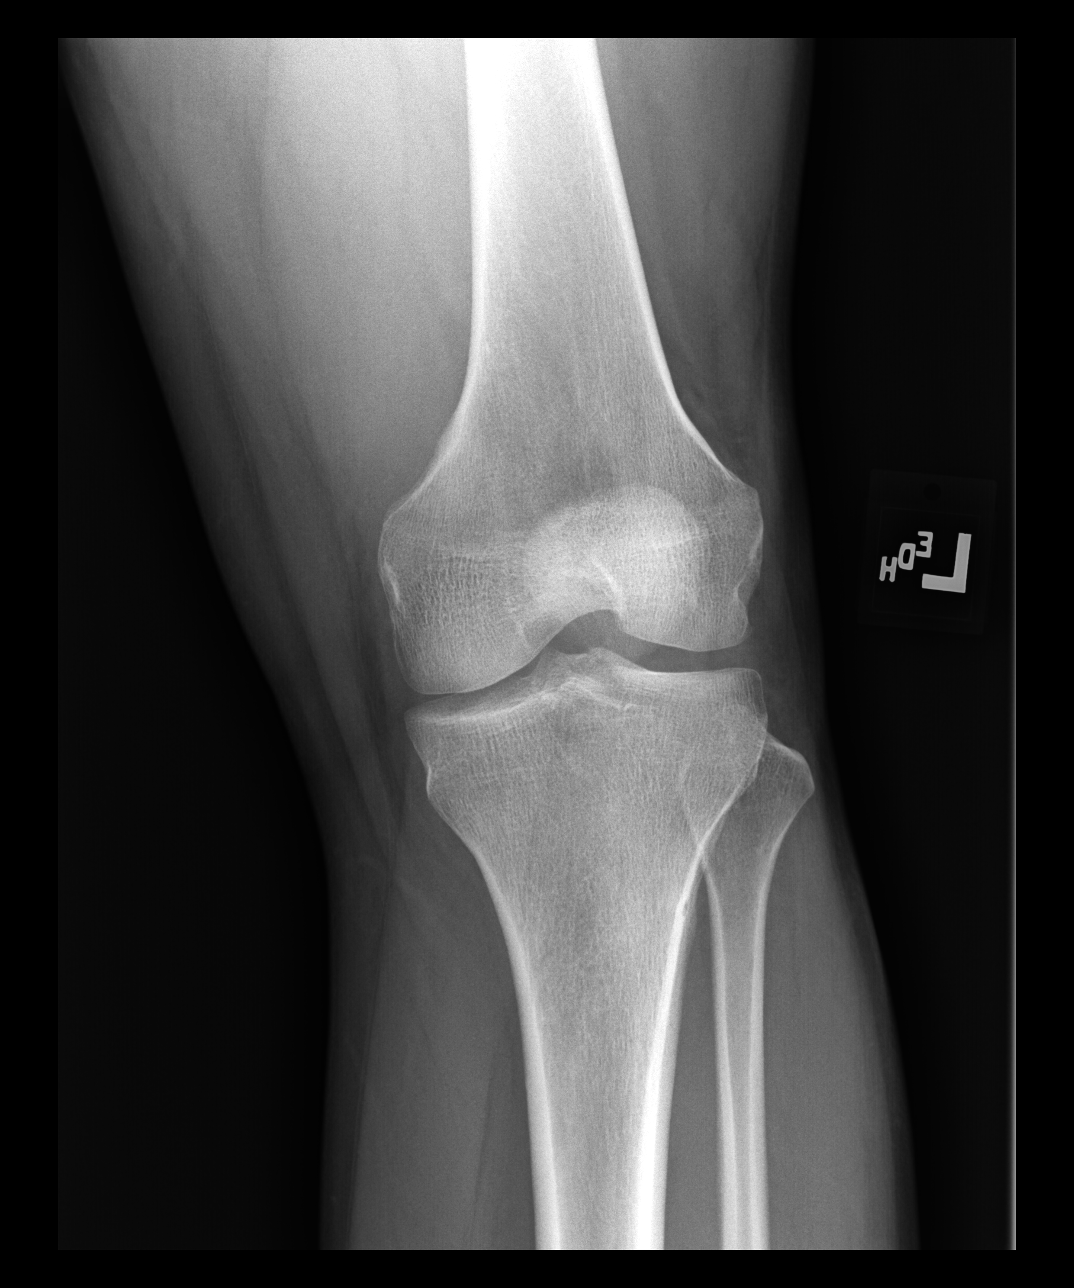

[dg knee complete 4 views left (4 of 4)]
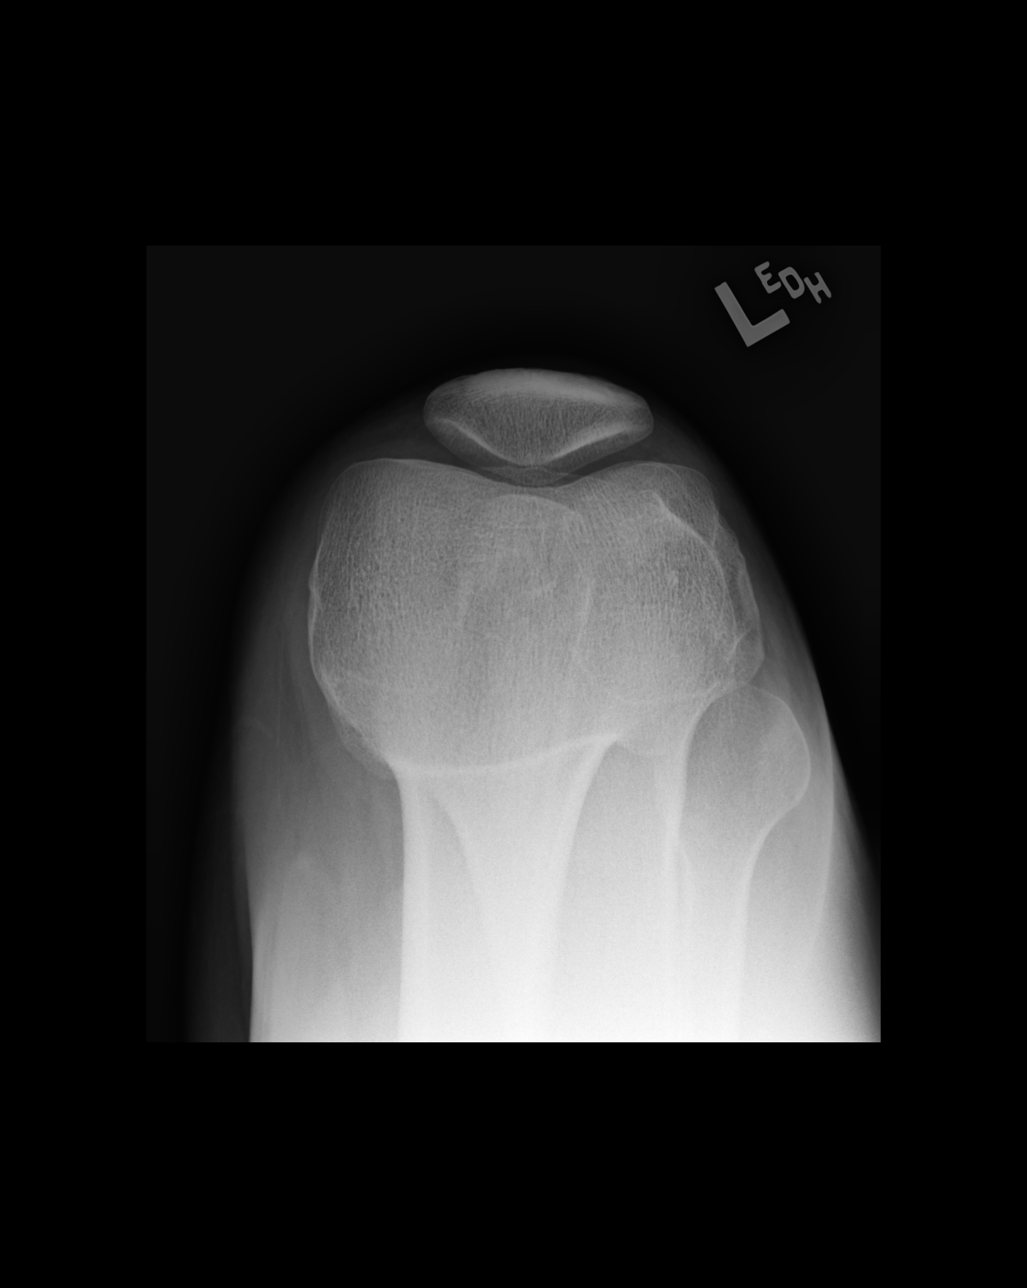

[4 of 4 positions shown; findings below may reference images not displayed]

FINDINGS: Osseous mineralization normal.

Joint spaces preserved.

No fracture, dislocation, or bone destruction.

No joint effusion.
IMPRESSION: Normal exam.

## 2020-08-13 ENCOUNTER — Encounter (HOSPITAL_BASED_OUTPATIENT_CLINIC_OR_DEPARTMENT_OTHER): Payer: Self-pay | Admitting: Orthopedic Surgery

## 2020-08-13 ENCOUNTER — Other Ambulatory Visit: Payer: Self-pay

## 2020-08-13 NOTE — Progress Notes (Signed)
Spoke w/ via phone for pre-op interview---PT with spanish interpreter [employer] joanna number [phone] Lab needs dos----    none           Lab results------none COVID test ------08-19-2020 at 1435 pm Arrive at -------530 am 08-21-2020 NPO after MN NO Solid Food.  Water from midnight until 430 am then npo Medications to take morning of surgery -----none Diabetic medication -----n/a Patient Special Instructions -----none Pre-Op special Istructions -----none Patient verbalized understanding of instructions that were given at this phone interview. Patient denies shortness of breath, chest pain, fever, cough at this phone interview.  Spanish interpreter requested via email for day of surgery , email confirmation placed on chart

## 2020-08-19 ENCOUNTER — Other Ambulatory Visit (HOSPITAL_COMMUNITY)
Admission: RE | Admit: 2020-08-19 | Discharge: 2020-08-19 | Disposition: A | Discharge status: Home / Self Care | Payer: HRSA Program | Source: Ambulatory Visit | Attending: Orthopedic Surgery | Admitting: Orthopedic Surgery

## 2020-08-19 DIAGNOSIS — Z20822 Contact with and (suspected) exposure to covid-19: Secondary | ICD-10-CM | POA: Diagnosis not present

## 2020-08-19 DIAGNOSIS — Z01812 Encounter for preprocedural laboratory examination: Secondary | ICD-10-CM | POA: Diagnosis present

## 2020-08-19 LAB — SARS CORONAVIRUS 2 (TAT 6-24 HRS): SARS Coronavirus 2: NEGATIVE

## 2020-08-21 ENCOUNTER — Encounter (HOSPITAL_BASED_OUTPATIENT_CLINIC_OR_DEPARTMENT_OTHER): Payer: Self-pay | Admitting: Orthopedic Surgery

## 2020-08-21 ENCOUNTER — Encounter (HOSPITAL_BASED_OUTPATIENT_CLINIC_OR_DEPARTMENT_OTHER)
Admission: RE | Disposition: A | Discharge status: Home / Self Care | Payer: Self-pay | Source: Home / Self Care | Attending: Orthopedic Surgery

## 2020-08-21 ENCOUNTER — Ambulatory Visit (HOSPITAL_BASED_OUTPATIENT_CLINIC_OR_DEPARTMENT_OTHER): Payer: Self-pay | Admitting: Certified Registered"

## 2020-08-21 ENCOUNTER — Other Ambulatory Visit: Payer: Self-pay

## 2020-08-21 ENCOUNTER — Ambulatory Visit (HOSPITAL_BASED_OUTPATIENT_CLINIC_OR_DEPARTMENT_OTHER)
Admission: RE | Admit: 2020-08-21 | Discharge: 2020-08-21 | Disposition: A | Discharge status: Home / Self Care | Payer: Self-pay | Attending: Orthopedic Surgery | Admitting: Orthopedic Surgery

## 2020-08-21 DIAGNOSIS — X58XXXA Exposure to other specified factors, initial encounter: Secondary | ICD-10-CM | POA: Insufficient documentation

## 2020-08-21 DIAGNOSIS — F17210 Nicotine dependence, cigarettes, uncomplicated: Secondary | ICD-10-CM | POA: Insufficient documentation

## 2020-08-21 DIAGNOSIS — S83242A Other tear of medial meniscus, current injury, left knee, initial encounter: Secondary | ICD-10-CM | POA: Insufficient documentation

## 2020-08-21 HISTORY — DX: Other tear of medial meniscus, current injury, unspecified knee, initial encounter: S83.249A

## 2020-08-21 HISTORY — PX: KNEE ARTHROSCOPY WITH MEDIAL MENISECTOMY: SHX5651

## 2020-08-21 SURGERY — ARTHROSCOPY, KNEE, WITH MEDIAL MENISCECTOMY
Anesthesia: General | Site: Knee | Laterality: Left

## 2020-08-21 MED ORDER — BUPIVACAINE HCL 0.25 % IJ SOLN
INTRAMUSCULAR | Status: DC | PRN
  Administered 2020-08-21: 9 mL

## 2020-08-21 MED ORDER — PROPOFOL 10 MG/ML IV BOLUS
INTRAVENOUS | Status: DC | PRN
  Administered 2020-08-21: 150 mg via INTRAVENOUS

## 2020-08-21 MED ORDER — CEFAZOLIN SODIUM-DEXTROSE 2-4 GM/100ML-% IV SOLN
2.0000 g | INTRAVENOUS | Status: AC
  Administered 2020-08-21: 2 g via INTRAVENOUS

## 2020-08-21 MED ORDER — LACTATED RINGERS IV SOLN: INTRAVENOUS | Status: DC

## 2020-08-21 MED ORDER — ONDANSETRON HCL 4 MG/2ML IJ SOLN: 4.0000 mg | Freq: Once | INTRAMUSCULAR | Status: DC | PRN

## 2020-08-21 MED ORDER — FENTANYL CITRATE (PF) 250 MCG/5ML IJ SOLN
INTRAMUSCULAR | Status: AC
  Filled 2020-08-21: qty 5

## 2020-08-21 MED ORDER — MEPERIDINE HCL 25 MG/ML IJ SOLN: 6.2500 mg | INTRAMUSCULAR | Status: DC | PRN

## 2020-08-21 MED ORDER — CEFAZOLIN SODIUM-DEXTROSE 2-4 GM/100ML-% IV SOLN
INTRAVENOUS | Status: AC
  Filled 2020-08-21: qty 100

## 2020-08-21 MED ORDER — MIDAZOLAM HCL 2 MG/2ML IJ SOLN
INTRAMUSCULAR | Status: AC
  Filled 2020-08-21: qty 2

## 2020-08-21 MED ORDER — ONDANSETRON HCL 4 MG/2ML IJ SOLN
INTRAMUSCULAR | Status: DC | PRN
  Administered 2020-08-21: 4 mg via INTRAVENOUS

## 2020-08-21 MED ORDER — LIDOCAINE 2% (20 MG/ML) 5 ML SYRINGE
INTRAMUSCULAR | Status: DC | PRN
  Administered 2020-08-21: 100 mg via INTRAVENOUS

## 2020-08-21 MED ORDER — KETOROLAC TROMETHAMINE 30 MG/ML IJ SOLN
INTRAMUSCULAR | Status: DC | PRN
  Administered 2020-08-21: 30 mg via INTRAVENOUS

## 2020-08-21 MED ORDER — LIDOCAINE 2% (20 MG/ML) 5 ML SYRINGE
INTRAMUSCULAR | Status: AC
  Filled 2020-08-21: qty 5

## 2020-08-21 MED ORDER — HYDROMORPHONE HCL 1 MG/ML IJ SOLN: 0.2500 mg | INTRAMUSCULAR | Status: DC | PRN

## 2020-08-21 MED ORDER — DEXAMETHASONE SODIUM PHOSPHATE 10 MG/ML IJ SOLN
INTRAMUSCULAR | Status: DC | PRN
  Administered 2020-08-21: 5 mg via INTRAVENOUS

## 2020-08-21 MED ORDER — MIDAZOLAM HCL 5 MG/5ML IJ SOLN
INTRAMUSCULAR | Status: DC | PRN
  Administered 2020-08-21: 2 mg via INTRAVENOUS

## 2020-08-21 MED ORDER — HYDROCODONE-ACETAMINOPHEN 5-325 MG PO TABS: 1.0000 | ORAL_TABLET | Freq: Four times a day (QID) | ORAL | 0 refills | Status: AC | PRN

## 2020-08-21 MED ORDER — SODIUM CHLORIDE 0.9 % IR SOLN
Status: DC | PRN
  Administered 2020-08-21: 1000 mL

## 2020-08-21 MED ORDER — PROPOFOL 10 MG/ML IV BOLUS
INTRAVENOUS | Status: AC
  Filled 2020-08-21: qty 40

## 2020-08-21 MED ORDER — ONDANSETRON 4 MG PO TBDP: 4.0000 mg | ORAL_TABLET | Freq: Three times a day (TID) | ORAL | 0 refills | Status: AC | PRN

## 2020-08-21 MED ORDER — FENTANYL CITRATE (PF) 100 MCG/2ML IJ SOLN
INTRAMUSCULAR | Status: DC | PRN
Start: 2020-08-21 — End: 2020-08-21
  Administered 2020-08-21 (×3): 50 ug via INTRAVENOUS

## 2020-08-21 SURGICAL SUPPLY — 44 items
ABLATOR ASPIRATE 50D MULTI-PRT (SURGICAL WAND) IMPLANT
BANDAGE ESMARK 6X9 LF (GAUZE/BANDAGES/DRESSINGS) IMPLANT
BLADE SHAVER TORPEDO 4X13 (MISCELLANEOUS) ×3 IMPLANT
BNDG ELASTIC 4X5.8 VLCR STR LF (GAUZE/BANDAGES/DRESSINGS) ×3 IMPLANT
BNDG ELASTIC 6X5.8 VLCR NS LF (GAUZE/BANDAGES/DRESSINGS) ×3 IMPLANT
BNDG ELASTIC 6X5.8 VLCR STR LF (GAUZE/BANDAGES/DRESSINGS) ×3 IMPLANT
BNDG ESMARK 6X9 LF (GAUZE/BANDAGES/DRESSINGS)
BNDG GAUZE ELAST 4 BULKY (GAUZE/BANDAGES/DRESSINGS) ×3 IMPLANT
BURR CLEARCUT OVAL 5.5X13 (MISCELLANEOUS) IMPLANT
BURR OVAL 12 FL 5.5MM X 13CM (MISCELLANEOUS)
BURR OVAL 12 FL 5.5X13 (MISCELLANEOUS)
COVER MAYO STAND STRL (DRAPES) ×3 IMPLANT
COVER WAND RF STERILE (DRAPES) ×3 IMPLANT
CUFF TOURN SGL QUICK 34 (TOURNIQUET CUFF)
CUFF TRNQT CYL 34X4.125X (TOURNIQUET CUFF) IMPLANT
DRAPE ARTHROSCOPY W/POUCH 114 (DRAPES) ×3 IMPLANT
DRAPE C-ARM 42X120 X-RAY (DRAPES) IMPLANT
DRAPE U-SHAPE 47X51 STRL (DRAPES) ×3 IMPLANT
DRSG PAD ABDOMINAL 8X10 ST (GAUZE/BANDAGES/DRESSINGS) ×3 IMPLANT
DURAPREP 26ML APPLICATOR (WOUND CARE) ×3 IMPLANT
GAUZE SPONGE 4X4 12PLY STRL (GAUZE/BANDAGES/DRESSINGS) ×3 IMPLANT
GAUZE SPONGE 4X4 12PLY STRL LF (GAUZE/BANDAGES/DRESSINGS) ×3 IMPLANT
GAUZE XEROFORM 1X8 LF (GAUZE/BANDAGES/DRESSINGS) ×3 IMPLANT
GLOVE BIO SURGEON STRL SZ7.5 (GLOVE) ×3 IMPLANT
GLOVE BIOGEL PI IND STRL 8 (GLOVE) ×1 IMPLANT
GLOVE BIOGEL PI INDICATOR 8 (GLOVE) ×2
GOWN STRL REUS W/TWL XL LVL3 (GOWN DISPOSABLE) ×6 IMPLANT
IV NS IRRIG 3000ML ARTHROMATIC (IV SOLUTION) ×3 IMPLANT
KIT TURNOVER CYSTO (KITS) ×3 IMPLANT
MANIFOLD NEPTUNE II (INSTRUMENTS) ×3 IMPLANT
PACK ARTHROSCOPY DSU (CUSTOM PROCEDURE TRAY) ×3 IMPLANT
PACK BASIN DAY SURGERY FS (CUSTOM PROCEDURE TRAY) ×3 IMPLANT
PAD CAST 4YDX4 CTTN HI CHSV (CAST SUPPLIES) ×1 IMPLANT
PADDING CAST COTTON 4X4 STRL (CAST SUPPLIES) ×2
PADDING CAST COTTON 6X4 STRL (CAST SUPPLIES) ×3 IMPLANT
PORT APPOLLO RF 90DEGREE MULTI (SURGICAL WAND) IMPLANT
SUT ETHILON 4 0 PS 2 18 (SUTURE) ×3 IMPLANT
SYR CONTROL 10ML LL (SYRINGE) ×3 IMPLANT
TOWEL OR 17X26 10 PK STRL BLUE (TOWEL DISPOSABLE) ×3 IMPLANT
TUBE CONNECTING 12'X1/4 (SUCTIONS) ×2
TUBE CONNECTING 12X1/4 (SUCTIONS) ×4 IMPLANT
TUBING ARTHROSCOPY IRRIG 16FT (MISCELLANEOUS) ×3 IMPLANT
WATER STERILE IRR 500ML POUR (IV SOLUTION) IMPLANT
WRAP KNEE MAXI GEL POST OP (GAUZE/BANDAGES/DRESSINGS) ×3 IMPLANT

## 2020-08-21 NOTE — H&P (Signed)
ORTHOPAEDIC H and P  REQUESTING PHYSICIAN: Yolonda Kida, MD  PCP:  Willaim Rayas, NP  Chief Complaint: Left knee pain  HPI: William Sutton is a 34 y.o. male who complains of left knee pain as well as mechanical symptoms.  He is here today for arthroscopic surgery.  No new complaints at this time.  Past Medical History:  Diagnosis Date  . Medial meniscus tear    left knee   Past Surgical History:  Procedure Laterality Date  . LAPAROSCOPIC APPENDECTOMY N/A 11/14/2016   Procedure: APPENDECTOMY LAPAROSCOPIC;  Surgeon: Abigail Miyamoto, MD;  Location: [facility] OR;  Service: General;  Laterality: N/A;   Social History   Socioeconomic History  . Marital status: Single    Spouse name: Not on file  . Number of children: Not on file  . Years of education: Not on file  . Highest education level: Not on file  Occupational History  . Not on file  Tobacco Use  . Smoking status: Current Some Day Smoker    Types: Cigarettes  . Smokeless tobacco: Never Used  . Tobacco comment: occ 4 x year  Vaping Use  . Vaping Use: Never used  Substance and Sexual Activity  . Alcohol use: Yes    Comment: occ  . Drug use: Never  . Sexual activity: Not on file  Other Topics Concern  . Not on file  Social History Narrative  . Not on file   Social Determinants of Health   Financial Resource Strain:   . Difficulty of Paying Living Expenses: Not on file  Food Insecurity:   . Worried About Programme researcher, broadcasting/film/video in the Last Year: Not on file  . Ran Out of Food in the Last Year: Not on file  Transportation Needs:   . Lack of Transportation (Medical): Not on file  . Lack of Transportation (Non-Medical): Not on file  Physical Activity:   . Days of Exercise per Week: Not on file  . Minutes of Exercise per Session: Not on file  Stress:   . Feeling of Stress : Not on file  Social Connections:   . Frequency of Communication with Friends and Family: Not on file  . Frequency of Social  Gatherings with Friends and Family: Not on file  . Attends Religious Services: Not on file  . Active Member of Clubs or Organizations: Not on file  . Attends Banker Meetings: Not on file  . Marital Status: Not on file   History reviewed. No pertinent family history. No Known Allergies Prior to Admission medications   Medication Sig Start Date End Date Taking? Authorizing Provider  ibuprofen (ADVIL) 800 MG tablet Take 800 mg by mouth every 8 (eight) hours as needed.   Yes [provider]   No results found.  Positive ROS: All other systems have been reviewed and were otherwise negative with the exception of those mentioned in the HPI and as above.  Physical Exam: General: Alert, no acute distress Cardiovascular: No pedal edema Respiratory: No cyanosis, no use of accessory musculature GI: No organomegaly, abdomen is soft and non-tender Skin: No lesions in the area of chief complaint Neurologic: Sensation intact distally Psychiatric: Patient is competent for consent with normal mood and affect Lymphatic: No axillary or cervical lymphadenopathy  MUSCULOSKELETAL:  Left lower extremity is clean and dry.  No skin lesions.  Neurovascularly intact  Assessment: Left knee medial meniscus tear, acute.  Plan: -We again discussed the nature of his injury as  well as our recommendation for surgery.  This was done utilizing a interpreter at the bedside.  We discussed the risk of arthroscopy with partial medial meniscectomy including bleeding, infection, damage to surrounding structures, stiffness, failure of pain relief, need for further surgery, risk of DVT and the risk of anesthesia.  He has provided informed consent.  -We will plan for discharge home from PACU postoperatively.    Yolonda Kida, MD Cell [phone]    08/21/2020 7:15 AM

## 2020-08-21 NOTE — Discharge Instructions (Signed)
-  Maintain postoperative bandage for 3 days.  He may remove on the third day and begin showering at that time.  Please do not submerge underwater.  -You may begin weightbearing on the left lower extremity immediately following surgery.  You may begin moving the knee with range of motion as tolerated immediately as well.  -You should apply ice to the knee for 20 to 30 minutes out of each hour that you are awake around-the-clock.  -For mild to moderate pain you should use Tylenol and Advil alternating around-the-clock.  For breakthrough pain use the hydrocodone as necessary.  -For the prevention of blood clots please use an 81 mg aspirin once per day for 6 weeks.  -Return to see Dr. Aundria Rud in 2 weeks for standard postop care and wound check.   Post Anesthesia Home Care Instructions  Activity: Get plenty of rest for the remainder of the day. A responsible adult should stay with you for 24 hours following the procedure.  For the next 24 hours, DO NOT: -Drive a car -Advertising copywriter -Drink alcoholic beverages -Take any medication unless instructed by your physician -Make any legal decisions or sign important papers.  Meals: Start with liquid foods such as gelatin or soup. Progress to regular foods as tolerated. Avoid greasy, spicy, heavy foods. If nausea and/or vomiting occur, drink only clear liquids until the nausea and/or vomiting subsides. Call your physician if vomiting continues.  Special Instructions/Symptoms: Your throat may feel dry or sore from the anesthesia or the breathing tube placed in your throat during surgery. If this causes discomfort, gargle with warm salt water. The discomfort should disappear within 24 hours.  If you had a scopolamine patch placed behind your ear for the management of post- operative nausea and/or vomiting:  1. The medication in the patch is effective for 72 hours, after which it should be removed.  Wrap patch in a tissue and discard in the trash.  Wash hands thoroughly with soap and water. 2. You may remove the patch earlier than 72 hours if you experience unpleasant side effects which may include dry mouth, dizziness or visual disturbances. 3. Avoid touching the patch. Wash your hands with soap and water after contact with the patch.

## 2020-08-21 NOTE — Anesthesia Procedure Notes (Signed)
Procedure Name: LMA Insertion Date/Time: 08/21/2020 7:40 AM Performed by: Marny Lowenstein, CRNA Pre-anesthesia Checklist: Patient identified, Emergency Drugs available, Suction available and Patient being monitored Patient Re-evaluated:Patient Re-evaluated prior to induction Oxygen Delivery Method: Circle system utilized Preoxygenation: Pre-oxygenation with 100% oxygen Induction Type: IV induction Ventilation: Mask ventilation without difficulty LMA: LMA inserted LMA Size: 4.0 Number of attempts: 1 Placement Confirmation: positive ETCO2 and breath sounds checked- equal and bilateral Tube secured with: Tape Dental Injury: Teeth and Oropharynx as per pre-operative assessment

## 2020-08-21 NOTE — Anesthesia Postprocedure Evaluation (Signed)
Anesthesia Post Note  Patient: [name]  Procedure(s) Performed: KNEE ARTHROSCOPY WITH PARTIAL MEDIAL MENISECTOMY (Left Knee)     Patient location during evaluation: PACU Anesthesia Type: General Level of consciousness: awake and alert Pain management: pain level controlled Vital Signs Assessment: post-procedure vital signs reviewed and stable Respiratory status: spontaneous breathing, nonlabored ventilation, respiratory function stable and patient connected to nasal cannula oxygen Cardiovascular status: blood pressure returned to baseline and stable Postop Assessment: no apparent nausea or vomiting Anesthetic complications: no   No complications documented.  Last Vitals:  Vitals:   08/21/20 0945 08/21/20 1045  BP: 120/89 121/79  Pulse: 72 77  Resp: 16 16  Temp:  37 C  SpO2: 98% 96%    Last Pain:  Vitals:   08/21/20 1045  TempSrc:   PainSc: 6                  Zacharius Funari DAVID

## 2020-08-21 NOTE — Brief Op Note (Signed)
08/21/2020  8:20 AM  PATIENT:  William Sutton  34 y.o. male  PRE-OPERATIVE DIAGNOSIS:  Left knee medial meniscus tear  POST-OPERATIVE DIAGNOSIS:  Left knee medial meniscus tear  PROCEDURE:  Procedure(s) with comments: KNEE ARTHROSCOPY WITH PARTIAL MEDIAL MENISECTOMY (Left) - 60 MINS  SURGEON:  Surgeon(s) and Role:    * Yolonda Kida, MD - Primary  PHYSICIAN ASSISTANT: Dion Saucier, PA-C.    ANESTHESIA:   local and general  EBL:  5 mL   BLOOD ADMINISTERED:none  DRAINS: none   LOCAL MEDICATIONS USED:  MARCAINE     SPECIMEN:  No Specimen  DISPOSITION OF SPECIMEN:  N/A  COUNTS:  YES  TOURNIQUET:  * Missing tourniquet times found for documented tourniquets in log: [id number] *  DICTATION: .Note written in EPIC  PLAN OF CARE: Discharge to home after PACU  PATIENT DISPOSITION:  PACU - hemodynamically stable.   Delay start of Pharmacological VTE agent (>24hrs) due to surgical blood loss or risk of bleeding: not applicable

## 2020-08-21 NOTE — Op Note (Signed)
Surgery Date: 08/21/2020  Surgeon(s): Yolonda Kida, MD  Physician Assistant: Dion Saucier, PA-C.  Assistant attestation:  PA Sharon Seller was utilized throughout the procedure for safe positioning and prepping of the patient.  Utilized during the case for assistance and holding the leg and accessing the medial compartment.  He was also utilized for closure and safe transfer back to the postanesthesia care unit.  ANESTHESIA:  general  FLUIDS: Per anesthesia record.   ESTIMATED BLOOD LOSS: minimal  PREOPERATIVE DIAGNOSES:  1. Left knee medial meniscus tear   POSTOPERATIVE DIAGNOSES:  same  PROCEDURES PERFORMED:  1. Left knee arthroscopy with limited synovectomy 2. knee arthroscopy with arthroscopic partial medial meniscectomy   DESCRIPTION OF PROCEDURE: William Sutton is a 34 y.o.-year-old male with left knee medial meniscus tear. Plans are to proceed with partial medial meniscectomy and diagnostic arthroscopy with debridement as indicated. Full discussion held regarding risks benefits alternatives and complications related surgical intervention. Conservative care options reviewed. All questions answered.  The patient was identified in the preoperative holding area and the operative extremity was marked. The patient was brought to the operating room and transferred to operating table in a supine position. Satisfactory general anesthesia was induced by anesthesiology.    Standard anterolateral, anteromedial arthroscopy portals were obtained. The anteromedial portal was obtained with a spinal needle for localization under direct visualization with subsequent diagnostic findings.   Anteromedial and anterolateral chambers: no synovitis. The synovitis was debrided with a 4.5 mm full radius shaver through both the anteromedial and lateral portals.   Suprapatellar pouch and gutters: no synovitis or debris. Patella chondral surface: Grade 0 Trochlear chondral surface: Grade  0 Patellofemoral tracking: midline Medial meniscus: short radial tear at the junction of the mid body and posterior horn.  Medial femoral condyle flexion bearing surface: Grade 0 Medial femoral condyle extension bearing surface: Grade 0 Medial tibial plateau: Grade 0 Anterior cruciate ligament:stable Posterior cruciate ligament:stable Lateral meniscus: intact.   Lateral femoral condyle flexion bearing surface: Grade 0 Lateral femoral condyle extension bearing surface: Grade 0 Lateral tibial plateau: Grade 0  The medial meniscus was debrided with combination of basket biter as well as motorized shaver.  The tear was noted to be at the junction of the mid body and posterior horn.  This was a short radial tear in the white zone with some undersurface tearing noted into the red-white zone.  This was all trimmed and taken back to stable meniscus.  After completion of synovectomy, diagnostic exam, and debridements as described, all compartments were checked and no residual debris remained. Hemostasis was achieved with the cautery wand. The portals were approximated with buried monocryl. All excess fluid was expressed from the joint.  Xeroform sterile gauze dressings were applied followed by Ace bandage and ice pack.   DISPOSITION: The patient was awakened from general anesthetic, extubated, taken to the recovery room in medically stable condition, no apparent complications. The patient may be weightbearing as tolerated to the operative lower extremity.  Range of motion of right knee as tolerated.

## 2020-08-21 NOTE — Anesthesia Preprocedure Evaluation (Signed)
Anesthesia Evaluation  Patient identified by MRN, date of birth, ID band Patient awake    Reviewed: Allergy & Precautions, NPO status , Patient's Chart, lab work & pertinent test results  Airway Mallampati: II  TM Distance: >3 FB Neck ROM: Full    Dental   Pulmonary Current Smoker and Patient abstained from smoking.,    Pulmonary exam normal        Cardiovascular Normal cardiovascular exam     Neuro/Psych    GI/Hepatic   Endo/Other    Renal/GU      Musculoskeletal   Abdominal   Peds  Hematology   Anesthesia Other Findings   Reproductive/Obstetrics                             Anesthesia Physical Anesthesia Plan  ASA: II  Anesthesia Plan: General   Post-op Pain Management:    Induction: Intravenous  PONV Risk Score and Plan: 1 and Ondansetron  Airway Management Planned: LMA  Additional Equipment:   Intra-op Plan:   Post-operative Plan:   Informed Consent: I have reviewed the patients History and Physical, chart, labs and discussed the procedure including the risks, benefits and alternatives for the proposed anesthesia with the patient or authorized representative who has indicated his/her understanding and acceptance.       Plan Discussed with: CRNA and Surgeon  Anesthesia Plan Comments:         Anesthesia Quick Evaluation

## 2020-08-21 NOTE — Transfer of Care (Signed)
Immediate Anesthesia Transfer of Care Note  Patient: William Sutton  Procedure(s) Performed: KNEE ARTHROSCOPY WITH PARTIAL MEDIAL MENISECTOMY (Left Knee)  Patient Location: PACU  Anesthesia Type:General  Level of Consciousness: drowsy and patient cooperative  Airway & Oxygen Therapy: Patient Spontanous Breathing and Patient connected to face mask oxygen  Post-op Assessment: Report given to RN and Post -op Vital signs reviewed and stable  Post vital signs: Reviewed and stable  Last Vitals:  Vitals Value Taken Time  BP 129/88 08/21/20 0830  Temp    Pulse 77 08/21/20 0834  Resp 11 08/21/20 0834  SpO2 98 % 08/21/20 0834  Vitals shown include unvalidated device data.  Last Pain:  Vitals:   08/21/20 0824  TempSrc:   PainSc: (P) 0-No pain         Complications: No complications documented.

## 2020-08-22 ENCOUNTER — Encounter (HOSPITAL_BASED_OUTPATIENT_CLINIC_OR_DEPARTMENT_OTHER): Payer: Self-pay | Admitting: Orthopedic Surgery

## 2020-09-04 ENCOUNTER — Encounter: Payer: Self-pay | Admitting: Physical Therapy

## 2020-09-04 ENCOUNTER — Ambulatory Visit: Payer: Self-pay | Admitting: Physical Therapy

## 2020-09-04 ENCOUNTER — Other Ambulatory Visit: Payer: Self-pay

## 2020-09-04 NOTE — Therapy (Signed)
[facility] [address] [place], [place], [address] Phone: [phone]   Fax:  [phone]  Patient Details  Name: William Sutton MRN: [id number] Date of Birth: 06/07/1986 Referring Provider:  Yolonda Kida, *  Encounter Date: 09/04/2020   Patient arrived but was not seen due to having his daughter present, which goes against current COVID-19 visitor restrictions. He evaluation was rescheduled for a future date.   Rosana Hoes, PT, DPT, LAT, ATC 09/04/20  3:27 PM Phone: [phone] Fax: [phone]   [facility] Outpatient Rehabilitation [facility] [address] [place], [place], [address] Phone: [phone]   Fax:  [phone]

## 2020-09-05 ENCOUNTER — Ambulatory Visit: Payer: Self-pay | Attending: Orthopedic Surgery | Admitting: Physical Therapy

## 2020-09-05 ENCOUNTER — Encounter: Payer: Self-pay | Admitting: Physical Therapy

## 2020-09-05 ENCOUNTER — Other Ambulatory Visit: Payer: Self-pay

## 2020-09-05 DIAGNOSIS — R600 Localized edema: Secondary | ICD-10-CM | POA: Insufficient documentation

## 2020-09-05 DIAGNOSIS — M25562 Pain in left knee: Secondary | ICD-10-CM | POA: Insufficient documentation

## 2020-09-05 DIAGNOSIS — M6281 Muscle weakness (generalized): Secondary | ICD-10-CM | POA: Insufficient documentation

## 2020-09-05 DIAGNOSIS — R2689 Other abnormalities of gait and mobility: Secondary | ICD-10-CM | POA: Insufficient documentation

## 2020-09-05 NOTE — Therapy (Signed)
[facility] Outpatient Rehabilitation [facility] [address] [place], [place], [address] Phone: [phone]   Fax:  [phone]  Physical Therapy Evaluation  Patient Details  Name: William Sutton MRN: [id number] Date of Birth: 1986/12/10 Referring Provider (PT): Yolonda Kida MD   Encounter Date: 09/05/2020   PT End of Session - 09/05/20 1145    Visit Number 1    Number of Visits 17    Date for PT Re-Evaluation 09/05/20    Authorization Type self pay    PT Start Time 1145    PT Stop Time 1228    PT Time Calculation (min) 43 min    Activity Tolerance Patient tolerated treatment well    Behavior During Therapy [facility] for tasks assessed/performed           Past Medical History:  Diagnosis Date   Medial meniscus tear    left knee    Past Surgical History:  Procedure Laterality Date   KNEE ARTHROSCOPY WITH MEDIAL MENISECTOMY Left 08/21/2020   Procedure: KNEE ARTHROSCOPY WITH PARTIAL MEDIAL MENISECTOMY;  Surgeon: Yolonda Kida, MD;  Location: [facility];  Service: Orthopedics;  Laterality: Left;  60 MINS   LAPAROSCOPIC APPENDECTOMY N/A 11/14/2016   Procedure: APPENDECTOMY LAPAROSCOPIC;  Surgeon: Abigail Miyamoto, MD;  [facility] OR;  Service: General;  Laterality: N/A;    There were no vitals filed for this visit.    Subjective Assessment - 09/05/20 1153    Subjective pt is 34 y.o M s.p L knee arthroscoapy / partial menisectomy on 08/21/2020 due to missing a step while lifting weight and was stuck there, and caused knee pain. today pt reports 5/10 today but reports increased pain this morning 6-7/10. He reports the pain fluctuates , and has been using crutches and trying to put as much weight through his LLE.    How long can you sit comfortably? unlimtied    How long can you stand comfortably? 10 -20 min    How long can you walk comfortably? 10-20 min    Patient Stated Goals decrease pain, return to work    Currently in  Pain? Yes    Pain Score 5     Pain Location Knee    Pain Orientation Left;Medial;Posterior    Pain Descriptors / Indicators Aching;Sore;Tightness;Tingling    Pain Type Surgical pain    Pain Radiating Towards N/T into the toes    Pain Onset More than a month ago    Pain Frequency Constant    Aggravating Factors  N/A    Pain Relieving Factors rested with it elevated    Multiple Pain Sites Yes              [facility] PT Assessment - 09/05/20 1146      Assessment   Medical Diagnosis s/p L knee arthroscopy/ partial menisectomy    Referring Provider (PT) Yolonda Kida MD    Onset Date/Surgical Date 08/21/20    Hand Dominance Right    Next MD Visit --   1 month   Prior Therapy yes   10 years ago     Precautions   Precautions None      Restrictions   Weight Bearing Restrictions No      Balance Screen   Has the patient fallen in the past 6 months No      Observation/Other Assessments   Focus on Therapeutic Outcomes (FOTO)  71% limited   37% limited     Posture/Postural Control   Posture/Postural  Control Postural limitations    Postural Limitations Rounded Shoulders;Forward head      ROM / Strength   AROM / PROM / Strength AROM;Strength;PROM      AROM   AROM Assessment Site Knee    Right/Left Knee Right;Left    Right Knee Extension 0    Right Knee Flexion 135    Left Knee Extension 0    Left Knee Flexion 95      PROM   PROM Assessment Site Knee    Right/Left Knee Left    Left Knee Flexion 105      Strength   Strength Assessment Site Knee;Hip    Right/Left Hip Right;Left    Right Hip Flexion 5/5    Right Hip ABduction 4/5    Left Hip Flexion 4+/5    Left Hip ABduction 4-/5    Right/Left Knee Right;Left    Right Knee Flexion 5/5    Right Knee Extension 5/5    Left Knee Flexion 4+/5   soreness noted during testing   Left Knee Extension 4+/5   soreness noted during testing     Palpation   Palpation comment TTP along the medial joint line and proximal  gastroc/soleus noted.       Ambulation/Gait   Ambulation/Gait Yes    Assistive device Crutches    Gait Pattern Step-through pattern;Decreased stride length;Decreased stance time - left;Decreased step length - right                      Objective measurements completed on examination: See above findings.       [facility] Adult PT Treatment/Exercise - 09/05/20 1146      Exercises   Exercises Knee/Hip      Knee/Hip Exercises: Stretches   Passive Hamstring Stretch 2 reps;Right;30 seconds    Gastroc Stretch Left;2 reps;30 seconds   with strap     Knee/Hip Exercises: Supine   Quad Sets 1 set;5 reps   5 seconds   Straight Leg Raises 1 set;5 reps;Left;Strengthening      Knee/Hip Exercises: Sidelying   Hip ABduction Left;1 set;10 reps                  PT Education - 09/05/20 1159    Education Details evaluation findings, POC, goals, HEP with proper form/ ratinale    Person(s) Educated Patient    Methods Explanation;Verbal cues;Handout    Comprehension Verbalized understanding;Verbal cues required            PT Short Term Goals - 09/05/20 1249      PT SHORT TERM GOAL #1   Title pt to be IND with inital HEP    Time 4    Period Weeks    Status New    Target Date 10/03/20      PT SHORT TERM GOAL #2   Title pt to be able to stand >/= 15 min with no AD with </= 5/10 pain    Time 4    Period Weeks    Status New    Target Date 10/03/20             PT Long Term Goals - 09/05/20 1249      PT LONG TERM GOAL #1   Title pt increase knee flexion to >/= 125 degrees for functional ROM required for efficient gait pattern.    Time 8    Period Weeks    Status New    Target Date 10/31/20  PT LONG TERM GOAL #2   Title increase LLE gross strength to >/= 4+/5 to promote hip/knee stability with standing/ walking.    Time 8    Period Weeks    Status New    Target Date 10/31/20      PT LONG TERM GOAL #3   Title pt to be able to stand / walk for >= 60  min with no AD with no report of pain or limitation.    Time 8    Period Weeks    Status New    Target Date 10/31/20      PT LONG TERM GOAL #4   Title increase FOTO score to </=37% limited to demo improvement in function    Time 8    Period Weeks    Status New    Target Date 10/31/20      PT LONG TERM GOAL #5   Title pt to be IND with all HEP to maintain and progress current LOF IND    Time 8    Period Weeks    Status New    Target Date 10/31/20                  Plan - 09/05/20 1159    Clinical Impression Statement pt presents to [facility] s/p L knee arthroscopy and menisectomy. mil limitations in knee flexion noted and weakness in the hip/ knee which is expected following surgery. TTP along the medial joint line and proximal gastroc/soleus noted. pt amb demonstrating antalgic pattern with limited stance on the LLE with bil crutches. He would benefit from physical therapy to decrease L knee pain, increase ROM and strength, improve gait efficiency and maximize overall function by addressing the deficits listed.    Stability/Clinical Decision Making Stable/Uncomplicated    Clinical Decision Making Low    PT Frequency 2x / week    PT Duration 8 weeks    PT Treatment/Interventions ADLs/Self Care Home Management;Cryotherapy;Electrical Stimulation;Iontophoresis 4mg /ml Dexamethasone;Moist Heat;Ultrasound;Stair training;Gait training;Therapeutic exercise;Therapeutic activities;Balance training;Neuromuscular re-education;Manual techniques;Passive range of motion;Dry needling;Vasopneumatic Device    PT Next Visit Plan review/ update HEP PRN, Review FOTO and provide handout. knee ROM , hip / knee strengthening. gait training. modalities PRN    PT Home Exercise Plan WYHYERTG - quad set, SLR, heel slide, seated calf stretch, seated hamstring stretch,    Consulted and Agree with Plan of Care Patient           Patient will benefit from skilled therapeutic intervention in order to improve the  following deficits and impairments:  Improper body mechanics, Increased muscle spasms, Postural dysfunction, Decreased activity tolerance, Decreased endurance, Pain, Decreased range of motion, Abnormal gait, Increased edema  Visit Diagnosis: Acute pain of left knee  Other abnormalities of gait and mobility  Muscle weakness (generalized)  Localized edema     Problem List Patient Active Problem List   Diagnosis Date Noted   Appendicitis 11/14/2016    11/16/2016 PT, DPT, LAT, ATC  09/05/20  12:57 PM      [facility] Outpatient Rehabilitation [facility] [address] [place], [place], [place] Phone: [phone]   Fax:  [phone]  Name: William Sutton MRN: Lavell Luster Date of Birth: 1986-06-06

## 2020-09-09 ENCOUNTER — Other Ambulatory Visit: Payer: Self-pay

## 2020-09-09 ENCOUNTER — Ambulatory Visit: Payer: Self-pay | Admitting: Physical Therapy

## 2020-09-09 DIAGNOSIS — M25562 Pain in left knee: Secondary | ICD-10-CM

## 2020-09-09 DIAGNOSIS — R2689 Other abnormalities of gait and mobility: Secondary | ICD-10-CM

## 2020-09-09 DIAGNOSIS — R600 Localized edema: Secondary | ICD-10-CM

## 2020-09-09 DIAGNOSIS — M6281 Muscle weakness (generalized): Secondary | ICD-10-CM

## 2020-09-10 NOTE — Therapy (Signed)
[facility] Outpatient Rehabilitation [facility] [address] [place], [place], [address] Phone: [phone]   Fax:  [phone]  Physical Therapy Treatment  Patient Details  Name: William Sutton MRN: [id number] Date of Birth: 07-03-1986 Referring Provider (PT): Yolonda Kida MD   Encounter Date: 09/09/2020   PT End of Session - 09/09/20 0825    Visit Number 2    Number of Visits 17    Date for PT Re-Evaluation 09/05/20    Authorization Type self pay    PT Start Time [phone]   Patient was 14 minutes late for appointment   PT Stop Time 0845    PT Time Calculation (min) 31 min    Activity Tolerance Patient tolerated treatment well    Behavior During Therapy [facility] for tasks assessed/performed           Past Medical History:  Diagnosis Date  . Medial meniscus tear    left knee    Past Surgical History:  Procedure Laterality Date  . KNEE ARTHROSCOPY WITH MEDIAL MENISECTOMY Left 08/21/2020   Procedure: KNEE ARTHROSCOPY WITH PARTIAL MEDIAL MENISECTOMY;  Surgeon: Yolonda Kida, MD;  Location: [facility];  Service: Orthopedics;  Laterality: Left;  60 MINS  . LAPAROSCOPIC APPENDECTOMY N/A 11/14/2016   Procedure: APPENDECTOMY LAPAROSCOPIC;  Surgeon: Abigail Miyamoto, MD;  Location: [facility] OR;  Service: General;  Laterality: N/A;    There were no vitals filed for this visit.   Subjective Assessment - 09/09/20 0817    Subjective Patient comes in today with 1 crutch. He reports his pain is controlled. It st still throbs at night but overall it is doing better .    Patient is accompained by: Interpreter   [name] [phone]   How long can you sit comfortably? unlimtied    How long can you stand comfortably? 10 -20 min    How long can you walk comfortably? 10-20 min    Patient Stated Goals decrease pain, return to work    Pain Score 4     Pain Location Knee    Pain Orientation Left;Posterior;Medial    Pain Descriptors / Indicators Aching     Pain Type Surgical pain    Pain Onset More than a month ago    Pain Frequency Constant    Aggravating Factors  standing and walking    Pain Relieving Factors rest and elevation                             [facility] Adult PT Treatment/Exercise - 09/10/20 0001      Ambulation/Gait   Gait Comments walked in bars without crutches. Cuing to keep a wider base of support but otherwise did well weight bearing. Walked while watching himself in the mirror. No increase in pain noted. 10'x8       Knee/Hip Exercises: Standing   Other Standing Knee Exercises heel raise x20; slow march x15 each leg       Knee/Hip Exercises: Supine   Quad Sets 1 set;10 reps    Short Arc Quad Sets 2 sets;10 reps    Straight Leg Raises 2 sets;10 reps      Knee/Hip Exercises: Sidelying   Hip ABduction Left;1 set;10 reps      Manual Therapy   Manual therapy comments assessed passive ROM. ROM measured at 120 with pain at end range.  PT Education - 09/09/20 5631    Education Details HEP and symptom mangement    Person(s) Educated Patient    Methods Explanation;Demonstration;Tactile cues;Verbal cues    Comprehension Verbalized understanding;Returned demonstration;Verbal cues required;Tactile cues required            PT Short Term Goals - 09/05/20 1249      PT SHORT TERM GOAL #1   Title pt to be IND with inital HEP    Time 4    Period Weeks    Status New    Target Date 10/03/20      PT SHORT TERM GOAL #2   Title pt to be able to stand >/= 15 min with no AD with </= 5/10 pain    Time 4    Period Weeks    Status New    Target Date 10/03/20             PT Long Term Goals - 09/05/20 1249      PT LONG TERM GOAL #1   Title pt increase knee flexion to >/= 125 degrees for functional ROM required for efficient gait pattern.    Time 8    Period Weeks    Status New    Target Date 10/31/20      PT LONG TERM GOAL #2   Title increase LLE gross strength to >/=  4+/5 to promote hip/knee stability with standing/ walking.    Time 8    Period Weeks    Status New    Target Date 10/31/20      PT LONG TERM GOAL #3   Title pt to be able to stand / walk for >= 60 min with no AD with no report of pain or limitation.    Time 8    Period Weeks    Status New    Target Date 10/31/20      PT LONG TERM GOAL #4   Title increase FOTO score to </=37% limited to demo improvement in function    Time 8    Period Weeks    Status New    Target Date 10/31/20      PT LONG TERM GOAL #5   Title pt to be IND with all HEP to maintain and progress current LOF IND    Time 8    Period Weeks    Status New    Target Date 10/31/20                 Plan - 09/09/20 0826    Clinical Impression Statement Patient was limited 2nd to being late. Therapy was still able to advance the reps of his exercises. We were also able to work in the II bars on walking without the crutch. Patients passive ROm measureed at 0120.He was able to tolerate ther-ex well. He was encoruaged to continue icing and levating if he has swelling a thome. He is making progreess.    Stability/Clinical Decision Making Stable/Uncomplicated    Clinical Decision Making Low    Rehab Potential Good    PT Frequency 2x / week    PT Duration 8 weeks    PT Treatment/Interventions ADLs/Self Care Home Management;Cryotherapy;Electrical Stimulation;Iontophoresis 4mg /ml Dexamethasone;Moist Heat;Ultrasound;Stair training;Gait training;Therapeutic exercise;Therapeutic activities;Balance training;Neuromuscular re-education;Manual techniques;Passive range of motion;Dry needling;Vasopneumatic Device    PT Next Visit Plan review/ update HEP PRN, Review FOTO and provide handout. knee ROM , hip / knee strengthening. gait training. modalities PRN consider adding steps when able    PT Home  Exercise Plan WYHYERTG - quad set, SLR, heel slide, seated calf stretch, seated hamstring stretch,    Consulted and Agree with Plan of  Care Patient           Patient will benefit from skilled therapeutic intervention in order to improve the following deficits and impairments:  Improper body mechanics, Increased muscle spasms, Postural dysfunction, Decreased activity tolerance, Decreased endurance, Pain, Decreased range of motion, Abnormal gait, Increased edema  Visit Diagnosis: Acute pain of left knee  Other abnormalities of gait and mobility  Muscle weakness (generalized)  Localized edema     Problem List Patient Active Problem List   Diagnosis Date Noted  . Appendicitis 11/14/2016    Dessie Coma PT DPT  09/10/2020, 8:16 AM  [facility] [address] [place], [place], [address] Phone: [phone]   Fax:  [phone]  Name: William Sutton MRN: [id number] Date of Birth: [birth year]

## 2020-09-16 ENCOUNTER — Ambulatory Visit: Payer: Self-pay | Admitting: Physical Therapy

## 2020-09-16 ENCOUNTER — Encounter: Payer: Self-pay | Admitting: Physical Therapy

## 2020-09-16 ENCOUNTER — Other Ambulatory Visit: Payer: Self-pay

## 2020-09-16 DIAGNOSIS — M25562 Pain in left knee: Secondary | ICD-10-CM

## 2020-09-16 DIAGNOSIS — M6281 Muscle weakness (generalized): Secondary | ICD-10-CM

## 2020-09-16 DIAGNOSIS — R600 Localized edema: Secondary | ICD-10-CM

## 2020-09-16 DIAGNOSIS — R2689 Other abnormalities of gait and mobility: Secondary | ICD-10-CM

## 2020-09-16 NOTE — Therapy (Signed)
[facility] Outpatient Rehabilitation [facility] [address] [place], [place], [address] Phone: [phone]   Fax:  [phone]  Physical Therapy Treatment  Patient Details  Name: William Sutton MRN: [id number] Date of Birth: 31-Jan-1986 Referring Provider (PT): Yolonda Kida MD   Encounter Date: 09/16/2020   PT End of Session - 09/16/20 1037    Visit Number 3    Number of Visits 17    Date for PT Re-Evaluation 09/05/20    Authorization Type self pay    PT Start Time 1017    PT Stop Time 1058    PT Time Calculation (min) 41 min    Activity Tolerance Patient tolerated treatment well    Behavior During Therapy [facility] for tasks assessed/performed           Past Medical History:  Diagnosis Date   Medial meniscus tear    left knee    Past Surgical History:  Procedure Laterality Date   KNEE ARTHROSCOPY WITH MEDIAL MENISECTOMY Left 08/21/2020   Procedure: KNEE ARTHROSCOPY WITH PARTIAL MEDIAL MENISECTOMY;  Surgeon: Yolonda Kida, MD;  Location: [facility];  Service: Orthopedics;  Laterality: Left;  60 MINS   LAPAROSCOPIC APPENDECTOMY N/A 11/14/2016   Procedure: APPENDECTOMY LAPAROSCOPIC;  Surgeon: Abigail Miyamoto, MD;  Location: [facility] OR;  Service: General;  Laterality: N/A;    There were no vitals filed for this visit.   Subjective Assessment - 09/16/20 1021    Subjective Patient reports it still hurts but it has been a little better. he has been walking saome without the crutch but still uses it just in case.    Patient is accompained by: Interpreter   Mariana Kaufman [phone]   How long can you sit comfortably? unlimtied    How long can you stand comfortably? 10 -20 min    How long can you walk comfortably? 10-20 min    Patient Stated Goals decrease pain, return to work    Currently in Pain? Yes    Pain Score 3     Pain Location Knee    Pain Orientation Left    Pain Descriptors / Indicators Aching    Pain Type Surgical pain     Pain Onset More than a month ago    Pain Frequency Constant    Aggravating Factors  standing and walking    Pain Relieving Factors rest and elevation    Multiple Pain Sites No                             [facility] Adult PT Treatment/Exercise - 09/16/20 0001      Knee/Hip Exercises: Standing   Heel Raises Limitations x20    Knee Flexion Limitations standing slow march 2x10     Lateral Step Up 2 sets;10 reps;Left;Step Height: 4"    Forward Step Up 2 sets;10 reps;Step Height: 4"    Functional Squat Limitations reviewed technique for proper squat x15       Knee/Hip Exercises: Supine   Short Arc Quad Sets 2 sets;15 reps    Straight Leg Raises 2 sets;10 reps      Knee/Hip Exercises: Sidelying   Hip ABduction Left;1 set;10 reps      Manual Therapy   Manual therapy comments assessed passive ROM. ROM measured at 120 with pain at end range.                   PT Education - 09/16/20 1036  Education Details activity progression    Person(s) Educated Patient    Methods Explanation;Demonstration;Tactile cues;Verbal cues    Comprehension Verbalized understanding;Returned demonstration;Verbal cues required;Tactile cues required            PT Short Term Goals - 09/16/20 1235      PT SHORT TERM GOAL #1   Title pt to be IND with inital HEP    Time 4    Period Weeks    Status On-going    Target Date 10/03/20      PT SHORT TERM GOAL #2   Title pt to be able to stand >/= 15 min with no AD with </= 5/10 pain    Time 4    Period Weeks    Status On-going    Target Date 10/03/20             PT Long Term Goals - 09/05/20 1249      PT LONG TERM GOAL #1   Title pt increase knee flexion to >/= 125 degrees for functional ROM required for efficient gait pattern.    Time 8    Period Weeks    Status New    Target Date 10/31/20      PT LONG TERM GOAL #2   Title increase LLE gross strength to >/= 4+/5 to promote hip/knee stability with standing/ walking.     Time 8    Period Weeks    Status New    Target Date 10/31/20      PT LONG TERM GOAL #3   Title pt to be able to stand / walk for >= 60 min with no AD with no report of pain or limitation.    Time 8    Period Weeks    Status New    Target Date 10/31/20      PT LONG TERM GOAL #4   Title increase FOTO score to </=37% limited to demo improvement in function    Time 8    Period Weeks    Status New    Target Date 10/31/20      PT LONG TERM GOAL #5   Title pt to be IND with all HEP to maintain and progress current LOF IND    Time 8    Period Weeks    Status New    Target Date 10/31/20                 Plan - 09/16/20 1041    Clinical Impression Statement Patient tolerated treatment well. He hada a mild increase in pain with ambualtion without the curtch and with standing exercises. His passive ROM improved from 120 degrees to 130 degrees. He was encouraged not to use the crtuch as much as possible. Therapy added stair training.    Stability/Clinical Decision Making Stable/Uncomplicated    Clinical Decision Making Low    Rehab Potential Good    PT Frequency 2x / week    PT Duration 8 weeks    PT Treatment/Interventions ADLs/Self Care Home Management;Cryotherapy;Electrical Stimulation;Iontophoresis 4mg /ml Dexamethasone;Moist Heat;Ultrasound;Stair training;Gait training;Therapeutic exercise;Therapeutic activities;Balance training;Neuromuscular re-education;Manual techniques;Passive range of motion;Dry needling;Vasopneumatic Device           Patient will benefit from skilled therapeutic intervention in order to improve the following deficits and impairments:  Improper body mechanics, Increased muscle spasms, Postural dysfunction, Decreased activity tolerance, Decreased endurance, Pain, Decreased range of motion, Abnormal gait, Increased edema  Visit Diagnosis: Acute pain of left knee  Other abnormalities of gait and mobility  Muscle weakness (generalized)  Localized  edema     Problem List Patient Active Problem List   Diagnosis Date Noted   Appendicitis 11/14/2016    Dessie Coma PT DPT  09/16/2020, 12:35 PM  [facility] Outpatient Rehabilitation [facility] [address] [place], [place], [address] Phone: [phone]   Fax:  [phone]  Name: Ryson Bacha MRN: [id number] Date of Birth: 02/02/1986

## 2020-09-22 ENCOUNTER — Other Ambulatory Visit: Payer: Self-pay

## 2020-09-22 ENCOUNTER — Encounter: Payer: Self-pay | Admitting: Physical Therapy

## 2020-09-22 ENCOUNTER — Ambulatory Visit: Payer: Self-pay | Admitting: Physical Therapy

## 2020-09-22 DIAGNOSIS — M6281 Muscle weakness (generalized): Secondary | ICD-10-CM

## 2020-09-22 DIAGNOSIS — M25562 Pain in left knee: Secondary | ICD-10-CM

## 2020-09-22 DIAGNOSIS — R2689 Other abnormalities of gait and mobility: Secondary | ICD-10-CM

## 2020-09-22 DIAGNOSIS — R600 Localized edema: Secondary | ICD-10-CM

## 2020-09-23 ENCOUNTER — Encounter: Payer: Self-pay | Admitting: Physical Therapy

## 2020-09-23 NOTE — Therapy (Signed)
[facility] Rehabilitation [facility] [address] [place], [place], [address] Phone: [phone]   Fax:  [phone]  Physical Therapy Treatment  Patient Details  Name: William Sutton MRN: [id number] Date of Birth: 12/12/1986 Referring Provider (PT): Yolonda Kida MD   Encounter Date: 09/22/2020   PT End of Session - 09/22/20 1606    Visit Number 4    Number of Visits 17    Date for PT Re-Evaluation 09/05/20    Authorization Type self pay    PT Start Time 1555   Patient 10 minutes late.   PT Stop Time 1630    PT Time Calculation (min) 35 min    Activity Tolerance Patient tolerated treatment well    Behavior During Therapy WFL for tasks assessed/performed           Past Medical History:  Diagnosis Date  . Medial meniscus tear    left knee    Past Surgical History:  Procedure Laterality Date  . KNEE ARTHROSCOPY WITH MEDIAL MENISECTOMY Left 08/21/2020   Procedure: KNEE ARTHROSCOPY WITH PARTIAL MEDIAL MENISECTOMY;  Surgeon: Yolonda Kida, MD;  Location: [facility];  Service: Orthopedics;  Laterality: Left;  60 MINS  . LAPAROSCOPIC APPENDECTOMY N/A 11/14/2016   Procedure: APPENDECTOMY LAPAROSCOPIC;  Surgeon: Abigail Miyamoto, MD;  Location: [facility] OR;  Service: General;  Laterality: N/A;    There were no vitals filed for this visit.   Subjective Assessment - 09/22/20 1559    Subjective Patient comes in today without the crtuch. He reports it is sore at times but overall it is improving.    Patient is accompained by: Interpreter   andy # [id number]   How long can you sit comfortably? unlimtied    How long can you stand comfortably? 10 -20 min    How long can you walk comfortably? 10-20 min    Patient Stated Goals decrease pain, return to work    Currently in Pain? No/denies                             [facility] Adult PT Treatment/Exercise - 09/23/20 0001      Knee/Hip Exercises: Stretches   Passive  Hamstring Stretch 2 reps;Right;30 seconds    Gastroc Stretch Left;2 reps;30 seconds      Knee/Hip Exercises: Standing   Heel Raises Limitations x20    Knee Flexion Limitations standing slow march 2x10     Lateral Step Up 2 sets;10 reps;Left;Step Height: 4"    Forward Step Up 2 sets;10 reps;Step Height: 4"    Step Down 2 sets;10 reps;Step Height: 4"    Functional Squat Limitations reviewed technique for proper squat x15     Other Standing Knee Exercises heel raise x20; slow march x15 each leg       Knee/Hip Exercises: Supine   Short Arc Quad Sets 2 sets;15 reps    Bridges Limitations 2x10     Straight Leg Raises 2 sets;10 reps      Knee/Hip Exercises: Sidelying   Hip ABduction Left;1 set;10 reps                  PT Education - 09/22/20 1600    Education Details reviewed tehcnique with ther-ex    Person(s) Educated Patient    Methods Explanation;Demonstration;Tactile cues;Verbal cues    Comprehension Verbalized understanding;Returned demonstration;Verbal cues required;Tactile cues required            PT Short  Term Goals - 09/16/20 1235      PT SHORT TERM GOAL #1   Title pt to be IND with inital HEP    Time 4    Period Weeks    Status On-going    Target Date 10/03/20      PT SHORT TERM GOAL #2   Title pt to be able to stand >/= 15 min with no AD with </= 5/10 pain    Time 4    Period Weeks    Status On-going    Target Date 10/03/20             PT Long Term Goals - 09/05/20 1249      PT LONG TERM GOAL #1   Title pt increase knee flexion to >/= 125 degrees for functional ROM required for efficient gait pattern.    Time 8    Period Weeks    Status New    Target Date 10/31/20      PT LONG TERM GOAL #2   Title increase LLE gross strength to >/= 4+/5 to promote hip/knee stability with standing/ walking.    Time 8    Period Weeks    Status New    Target Date 10/31/20      PT LONG TERM GOAL #3   Title pt to be able to stand / walk for >= 60 min with  no AD with no report of pain or limitation.    Time 8    Period Weeks    Status New    Target Date 10/31/20      PT LONG TERM GOAL #4   Title increase FOTO score to </=37% limited to demo improvement in function    Time 8    Period Weeks    Status New    Target Date 10/31/20      PT LONG TERM GOAL #5   Title pt to be IND with all HEP to maintain and progress current LOF IND    Time 8    Period Weeks    Status New    Target Date 10/31/20                 Plan - 09/22/20 1607    Clinical Impression Statement Patient was limited by being late but tolerated exercises well. Therapy was able to advance wieght with table exercises andreps. Therapy alos advanced table exercises. overall he is making good progress. Therapy will continue to progress as tolerated.    Stability/Clinical Decision Making Stable/Uncomplicated    Clinical Decision Making Low    Rehab Potential Good    PT Frequency 2x / week    PT Duration 8 weeks    PT Treatment/Interventions ADLs/Self Care Home Management;Cryotherapy;Electrical Stimulation;Iontophoresis 4mg /ml Dexamethasone;Moist Heat;Ultrasound;Stair training;Gait training;Therapeutic exercise;Therapeutic activities;Balance training;Neuromuscular re-education;Manual techniques;Passive range of motion;Dry needling;Vasopneumatic Device    PT Next Visit Plan review/ update HEP PRN, Review FOTO and provide handout. knee ROM , hip / knee strengthening. gait training. modalities PRN consider adding steps when able    PT Home Exercise Plan [facility] - quad set, SLR, heel slide, seated calf stretch, seated hamstring stretch,    Consulted and Agree with Plan of Care Patient           Patient will benefit from skilled therapeutic intervention in order to improve the following deficits and impairments:  Improper body mechanics, Increased muscle spasms, Postural dysfunction, Decreased activity tolerance, Decreased endurance, Pain, Decreased range of motion, Abnormal  gait, Increased edema  Visit Diagnosis: Acute pain of left knee  Other abnormalities of gait and mobility  Muscle weakness (generalized)  Localized edema     Problem List Patient Active Problem List   Diagnosis Date Noted  . Appendicitis 11/14/2016    French Ana PT 09/23/2020, 10:57 AM  [facility] [address] [place], [place], [address] Phone: [phone]   Fax:  [phone]  Name: Sebastiano Luecke MRN: [id number] Date of Birth: 1986-07-08

## 2020-09-24 ENCOUNTER — Encounter: Payer: Self-pay | Admitting: Physical Therapy

## 2020-09-29 ENCOUNTER — Other Ambulatory Visit: Payer: Self-pay

## 2020-09-29 ENCOUNTER — Ambulatory Visit: Payer: Self-pay | Attending: Orthopedic Surgery | Admitting: Physical Therapy

## 2020-09-29 ENCOUNTER — Encounter: Payer: Self-pay | Admitting: Physical Therapy

## 2020-09-29 DIAGNOSIS — M25562 Pain in left knee: Secondary | ICD-10-CM | POA: Insufficient documentation

## 2020-09-29 DIAGNOSIS — M6281 Muscle weakness (generalized): Secondary | ICD-10-CM | POA: Insufficient documentation

## 2020-09-29 DIAGNOSIS — R2689 Other abnormalities of gait and mobility: Secondary | ICD-10-CM | POA: Insufficient documentation

## 2020-09-29 DIAGNOSIS — R600 Localized edema: Secondary | ICD-10-CM | POA: Insufficient documentation

## 2020-09-29 NOTE — Therapy (Signed)
[facility] Outpatient Rehabilitation [facility] [address] [place], [place], [address] Phone: [phone]   Fax:  [phone]  Physical Therapy Treatment  Patient Details  Name: William Sutton MRN: [id number] Date of Birth: [birth year] Referring Provider (PT): Yolonda Kida MD   Encounter Date: 09/29/2020   PT End of Session - 09/29/20 0851    Visit Number 5    Number of Visits 17    Date for PT Re-Evaluation 10/31/20    Authorization Type self pay    PT Start Time [phone]   pt arrived late   PT Stop Time 0930    PT Time Calculation (min) 39 min           Past Medical History:  Diagnosis Date   Medial meniscus tear    left knee    Past Surgical History:  Procedure Laterality Date   KNEE ARTHROSCOPY WITH MEDIAL MENISECTOMY Left 08/21/2020   Procedure: KNEE ARTHROSCOPY WITH PARTIAL MEDIAL MENISECTOMY;  Surgeon: Yolonda Kida, MD;  Location: [facility];  Service: Orthopedics;  Laterality: Left;  60 MINS   LAPAROSCOPIC APPENDECTOMY N/A 11/14/2016   Procedure: APPENDECTOMY LAPAROSCOPIC;  Surgeon: Abigail Miyamoto, MD;  Location: [facility] OR;  Service: General;  Laterality: N/A;    There were no vitals filed for this visit.   Subjective Assessment - 09/29/20 0854    Subjective " I am getting better, it only hurts to go up the stairs. The knee hurts on both inside/ outside of knee."    Patient Stated Goals decrease pain, return to work    Currently in Pain? Yes    Pain Score 4     Pain Orientation Left    Pain Descriptors / Indicators Aching    Pain Type Chronic pain    Pain Onset More than a month ago    Pain Frequency Intermittent    Aggravating Factors  stairs    Pain Relieving Factors rest and elevation.              [facility] PT Assessment - 09/29/20 0001      Assessment   Medical Diagnosis s/p L knee arthroscopy/ partial menisectomy    Referring Provider (PT) Yolonda Kida MD                          [facility] Adult PT Treatment/Exercise - 09/29/20 0001      Therapeutic Activites    Therapeutic Activities Lifting    Lifting pushing sled 6 x 40 ft,        Knee/Hip Exercises: Aerobic   Elliptical L4 x 5 min ramp L1      Knee/Hip Exercises: Machines for Strengthening   Cybex Knee Extension 2 x 12 15#    Cybex Knee Flexion 2 x 12 20#    Cybex Leg Press --    Total Gym Leg Press 3 x 10 45#      Knee/Hip Exercises: Standing   Lateral Step Up 2 sets;10 reps;Step Height: 6"    Step Down 2 sets;10 reps;Left;Step Height: 6"   dc'd 6 inch step modified to 4in due to pain   Other Standing Knee Exercises dead lift from 6 inch step 15# 2 x 10                     PT Short Term Goals - 09/16/20 1235      PT SHORT TERM GOAL #1   Title pt to  be IND with inital HEP    Time 4    Period Weeks    Status On-going    Target Date 10/03/20      PT SHORT TERM GOAL #2   Title pt to be able to stand >/= 15 min with no AD with </= 5/10 pain    Time 4    Period Weeks    Status On-going    Target Date 10/03/20             PT Long Term Goals - 09/05/20 1249      PT LONG TERM GOAL #1   Title pt increase knee flexion to >/= 125 degrees for functional ROM required for efficient gait pattern.    Time 8    Period Weeks    Status New    Target Date 10/31/20      PT LONG TERM GOAL #2   Title increase LLE gross strength to >/= 4+/5 to promote hip/knee stability with standing/ walking.    Time 8    Period Weeks    Status New    Target Date 10/31/20      PT LONG TERM GOAL #3   Title pt to be able to stand / walk for >= 60 min with no AD with no report of pain or limitation.    Time 8    Period Weeks    Status New    Target Date 10/31/20      PT LONG TERM GOAL #4   Title increase FOTO score to </=37% limited to demo improvement in function    Time 8    Period Weeks    Status New    Target Date 10/31/20      PT LONG TERM GOAL #5   Title pt to be  IND with all HEP to maintain and progress current LOF IND    Time 8    Period Weeks    Status New    Target Date 10/31/20                 Plan - 09/29/20 4920    Clinical Impression Statement pt reports continued intermittent pain today reporting 4/10 today and reports most difficulty with stairs. He did well with hip / knee strengthening but did report mild soreness in the knee but was able to complete exercises.  Worked on fucntional strengthening in regard to lifting / pushing activities as it relates work specific tasks.  pt requested to schedule one more visit to get him to his next MD appointment and see if more PT is needed.    PT Treatment/Interventions ADLs/Self Care Home Management;Cryotherapy;Electrical Stimulation;Iontophoresis 4mg /ml Dexamethasone;Moist Heat;Ultrasound;Stair training;Gait training;Therapeutic exercise;Therapeutic activities;Balance training;Neuromuscular re-education;Manual techniques;Passive range of motion;Dry needling;Vasopneumatic Device    PT Next Visit Plan FOTO next session. sreview/ update HEP PRN, knee ROM , hip / knee strengthening. gait training. modalities PRN consider adding steps when able, functional lifting/ work related activities.    PT Home Exercise Plan [facility] - quad set, SLR, heel slide, seated calf stretch, seated hamstring stretch,    Consulted and Agree with Plan of Care Patient           Patient will benefit from skilled therapeutic intervention in order to improve the following deficits and impairments:  Improper body mechanics, Increased muscle spasms, Postural dysfunction, Decreased activity tolerance, Decreased endurance, Pain, Decreased range of motion, Abnormal gait, Increased edema  Visit Diagnosis: Acute pain of left knee  Other abnormalities of gait  and mobility  Muscle weakness (generalized)  Localized edema     Problem List Patient Active Problem List   Diagnosis Date Noted   Appendicitis 11/14/2016    Lulu Riding PT, DPT, LAT, ATC  09/29/20  9:33 AM      [facility] [address] [place], [place], [address] Phone: [phone]   Fax:  [phone]  Name: William Sutton MRN: [id number] Date of Birth: February 17, 1986

## 2020-10-01 ENCOUNTER — Encounter: Payer: Self-pay | Admitting: Physical Therapy

## 2020-10-06 ENCOUNTER — Ambulatory Visit: Payer: Self-pay | Admitting: Physical Therapy

## 2020-10-06 ENCOUNTER — Encounter: Payer: Self-pay | Admitting: Physical Therapy

## 2020-10-06 ENCOUNTER — Other Ambulatory Visit: Payer: Self-pay

## 2020-10-06 DIAGNOSIS — M6281 Muscle weakness (generalized): Secondary | ICD-10-CM

## 2020-10-06 DIAGNOSIS — M25562 Pain in left knee: Secondary | ICD-10-CM

## 2020-10-06 DIAGNOSIS — R600 Localized edema: Secondary | ICD-10-CM

## 2020-10-06 DIAGNOSIS — R2689 Other abnormalities of gait and mobility: Secondary | ICD-10-CM

## 2020-10-06 NOTE — Therapy (Signed)
[facility] Outpatient Rehabilitation [facility] [address] [place], [place], [address] Phone: [phone]   Fax:  [phone]  Physical Therapy Treatment  Patient Details  Name: William Sutton MRN: [id number] Date of Birth: Feb 04, 1986 Referring Provider (PT): Yolonda Kida MD   Encounter Date: 10/06/2020   PT End of Session - 10/06/20 1636    Visit Number 6    Number of Visits 17    Date for PT Re-Evaluation 10/31/20    Authorization Type self pay    PT Start Time 1638    PT Stop Time 1720    PT Time Calculation (min) 42 min    Activity Tolerance Patient tolerated treatment well    Behavior During Therapy [facility] for tasks assessed/performed           Past Medical History:  Diagnosis Date  . Medial meniscus tear    left knee    Past Surgical History:  Procedure Laterality Date  . KNEE ARTHROSCOPY WITH MEDIAL MENISECTOMY Left 08/21/2020   Procedure: KNEE ARTHROSCOPY WITH PARTIAL MEDIAL MENISECTOMY;  Surgeon: Yolonda Kida, MD;  Location: [facility];  Service: Orthopedics;  Laterality: Left;  60 MINS  . LAPAROSCOPIC APPENDECTOMY N/A 11/14/2016   Procedure: APPENDECTOMY LAPAROSCOPIC;  Surgeon: Abigail Miyamoto, MD;  Location: [facility] OR;  Service: General;  Laterality: N/A;    There were no vitals filed for this visit.   Subjective Assessment - 10/06/20 1640    Subjective "I am feeling better but sometimes am sore after doing the home exercises."    Patient is accompained by: Interpreter    How long can you sit comfortably? unlimtied    How long can you stand comfortably? 10 -20 min    How long can you walk comfortably? 10-20 min    Patient Stated Goals decrease pain, return to work    Currently in Pain? Yes    Pain Score 3     Pain Location Knee    Pain Orientation Left    Pain Descriptors / Indicators Aching    Pain Type Chronic pain    Pain Onset More than a month ago              [facility] PT Assessment -  10/06/20 1646      Assessment   Medical Diagnosis s/p L knee arthroscopy/ partial menisectomy    Referring Provider (PT) Yolonda Kida MD      AROM   Left Knee Extension 0    Left Knee Flexion 130                         [facility] Adult PT Treatment/Exercise - 10/06/20 1648      Therapeutic Activites    Lifting pushing sled 6 x 15 ft with 40lbs        Knee/Hip Exercises: Stretches   Passive Hamstring Stretch 2 reps;30 seconds;Left    Quad Stretch Limitations supine quad stretch with LLE off mat and in knee flexion      Knee/Hip Exercises: Machines for Strengthening   Cybex Knee Extension 2 x 15 20#    Cybex Knee Flexion 2 x 15 35#      Knee/Hip Exercises: Standing   Lateral Step Up 2 sets;10 reps;Step Height: 6"    Step Down 2 sets;10 reps;Step Height: 6"      Manual Therapy   Manual Therapy Soft tissue mobilization;Myofascial release    Soft tissue mobilization soft tissue mobilization and IASTM  to vastus lateralis and rectus femoris    Myofascial Release myofascial trigger point release to vastus lateralis                  PT Education - 10/06/20 1736    Education Details Pt education provided regarding use of butter knife handle to achieve similar effects as IASTM and use of ice when pt has soreness after increased activity.    Person(s) Educated Patient    Methods Explanation;Demonstration;Tactile cues;Verbal cues    Comprehension Verbalized understanding;Returned demonstration;Tactile cues required;Verbal cues required            PT Short Term Goals - 10/06/20 1738      PT SHORT TERM GOAL #1   Title pt to be IND with inital HEP    Time 4    Period Weeks    Status Achieved    Target Date 10/03/20      PT SHORT TERM GOAL #2   Title pt to be able to stand >/= 15 min with no AD with </= 5/10 pain    Time 4    Period Weeks    Status Achieved    Target Date 10/03/20             PT Long Term Goals - 09/05/20 1249      PT LONG  TERM GOAL #1   Title pt increase knee flexion to >/= 125 degrees for functional ROM required for efficient gait pattern.    Time 8    Period Weeks    Status New    Target Date 10/31/20      PT LONG TERM GOAL #2   Title increase LLE gross strength to >/= 4+/5 to promote hip/knee stability with standing/ walking.    Time 8    Period Weeks    Status New    Target Date 10/31/20      PT LONG TERM GOAL #3   Title pt to be able to stand / walk for >= 60 min with no AD with no report of pain or limitation.    Time 8    Period Weeks    Status New    Target Date 10/31/20      PT LONG TERM GOAL #4   Title increase FOTO score to </=37% limited to demo improvement in function    Time 8    Period Weeks    Status New    Target Date 10/31/20      PT LONG TERM GOAL #5   Title pt to be IND with all HEP to maintain and progress current LOF IND    Time 8    Period Weeks    Status New    Target Date 10/31/20                 Plan - 10/06/20 1722    Clinical Impression Statement Pt states he feels like he has made progress but continues to have soreness and pain at surgical site after 30 minutes of walking and when kneeling or lunging. PT provided education to pt regarding use of ice and soft tissue mobilization for pain management in sore areas at home following increased activity. Pt reports that he was cleared to return to work in early November per his most recent MD visit. Pt expressed desire to continue skilled PT intervention every other week to allow for functional strengthening before returning to work in early November.    PT Frequency Other (comment)  1x every other week   PT Duration 8 weeks   to allow for 4 visits (every other week)   PT Treatment/Interventions ADLs/Self Care Home Management;Cryotherapy;Electrical Stimulation;Iontophoresis 4mg /ml Dexamethasone;Moist Heat;Ultrasound;Stair training;Gait training;Therapeutic exercise;Therapeutic activities;Balance  training;Neuromuscular re-education;Manual techniques;Passive range of motion;Dry needling;Vasopneumatic Device    PT Next Visit Plan Complete FOTO next session, work on lunges and work towards kneeling on foam pad as tolerated, IASTM and soft tissue mobilization to vastus lateralis in LLE, review/ update HEP PRN, knee ROM, hi /knee strengthening. gait training. modalities PRN consider adding steps when able, functional lifting/ work related activities.    PT Home Exercise Plan [facility] - quad set, SLR, heel slide, seated calf stretch, seated hamstring stretch,    Consulted and Agree with Plan of Care Patient           Patient will benefit from skilled therapeutic intervention in order to improve the following deficits and impairments:  Improper body mechanics, Increased muscle spasms, Postural dysfunction, Decreased activity tolerance, Decreased endurance, Pain, Decreased range of motion, Abnormal gait, Increased edema  Visit Diagnosis: Acute pain of left knee  Other abnormalities of gait and mobility  Muscle weakness (generalized)  Localized edema     Problem List Patient Active Problem List   Diagnosis Date Noted  . Appendicitis 11/14/2016   11/16/2016, PT, DPT 10/06/20 5:39 PM  [facility] [address] [place], [place], [place] Phone: [phone]   Fax:  [phone]  Name: William Sutton MRN: Lavell Luster Date of Birth: 03-24-1986

## 2020-10-24 ENCOUNTER — Encounter: Payer: Self-pay | Admitting: Physical Therapy

## 2020-10-24 ENCOUNTER — Ambulatory Visit: Payer: Self-pay | Admitting: Physical Therapy

## 2020-10-24 ENCOUNTER — Other Ambulatory Visit: Payer: Self-pay

## 2020-10-24 DIAGNOSIS — M25562 Pain in left knee: Secondary | ICD-10-CM

## 2020-10-24 DIAGNOSIS — R2689 Other abnormalities of gait and mobility: Secondary | ICD-10-CM

## 2020-10-24 DIAGNOSIS — R600 Localized edema: Secondary | ICD-10-CM

## 2020-10-24 DIAGNOSIS — M6281 Muscle weakness (generalized): Secondary | ICD-10-CM

## 2020-10-24 NOTE — Therapy (Addendum)
[place], [place], [address] Phone: [phone]   Fax:  [phone]  Physical Therapy Treatment/Discharge   Patient Details  Name: William Sutton MRN: [id number] Date of Birth: 1986/03/18 Referring Provider (PT): Nicholes Stairs MD   Encounter Date: 10/24/2020   PT End of Session - 10/24/20 0812    Visit Number 7    Number of Visits 17    Date for PT Re-Evaluation 10/31/20    Authorization Type self pay    PT Start Time 0801    PT Stop Time 0840    PT Time Calculation (min) 39 min    Activity Tolerance Patient tolerated treatment well    Behavior During Therapy [facility] for tasks assessed/performed           Past Medical History:  Diagnosis Date  . Medial meniscus tear    left knee    Past Surgical History:  Procedure Laterality Date  . KNEE ARTHROSCOPY WITH MEDIAL MENISECTOMY Left 08/21/2020   Procedure: KNEE ARTHROSCOPY WITH PARTIAL MEDIAL MENISECTOMY;  Surgeon: Nicholes Stairs, MD;  Location: [facility];  Service: Orthopedics;  Laterality: Left;  60 MINS  . LAPAROSCOPIC APPENDECTOMY N/A 11/14/2016   Procedure: APPENDECTOMY LAPAROSCOPIC;  Surgeon: Coralie Keens, MD;  Location: [place];  Service: General;  Laterality: N/A;    There were no vitals filed for this visit.   Subjective Assessment - 10/24/20 0811    Subjective Patient reports pain at times with the exercises but overall he is doing well. he is having no pain this morning    Patient is accompained by: Interpreter   Fernand Parkins [phone]   How long can you sit comfortably? unlimtied    How long can you stand comfortably? 10 -20 min    How long can you walk comfortably? 10-20 min    Patient Stated Goals decrease pain, return to work    Currently in Pain? No/denies                             [facility] Adult PT Treatment/Exercise - 10/24/20 0001      Ambulation/Gait   Gait Comments walked in  bars without crutches. Cuing to keep a wider base of support but otherwise did well weight bearing. Walked while watching himself in the mirror. No increase in pain noted. 10'x8       Self-Care   Self-Care Other Self-Care Comments    Other Self-Care Comments  lift from step with 15lb  2x5; first reviewed lifting at the cabinet. Cuing not to let his knees come forward.       Knee/Hip Exercises: Standing   Other Standing Knee Exercises heel raise x20; slow march x15 each leg     Other Standing Knee Exercises step onto air-ex 2x10       Knee/Hip Exercises: Supine   Quad Sets 1 set;10 reps    Short Arc Quad Sets 2 sets;10 reps    Straight Leg Raises 2 sets;10 reps      Knee/Hip Exercises: Sidelying   Hip ABduction Left;1 set;10 reps      Manual Therapy   Manual therapy comments assessed passive ROM. ROM measured at 120 with pain at end range.                   PT Education - 10/24/20 [id number]    Education Details reviewed HEp and symptom mangement    Person(s)  Educated Patient    Methods Explanation;Demonstration;Tactile cues;Verbal cues    Comprehension Verbalized understanding;Returned demonstration;Verbal cues required;Tactile cues required            PT Short Term Goals - 10/06/20 1738      PT SHORT TERM GOAL #1   Title pt to be IND with inital HEP    Time 4    Period Weeks    Status Achieved    Target Date 10/03/20      PT SHORT TERM GOAL #2   Title pt to be able to stand >/= 15 min with no AD with </= 5/10 pain    Time 4    Period Weeks    Status Achieved    Target Date 10/03/20             PT Long Term Goals - 09/05/20 1249      PT LONG TERM GOAL #1   Title pt increase knee flexion to >/= 125 degrees for functional ROM required for efficient gait pattern.    Time 8    Period Weeks    Status New    Target Date 10/31/20      PT LONG TERM GOAL #2   Title increase LLE gross strength to >/= 4+/5 to promote hip/knee stability with standing/ walking.      Time 8    Period Weeks    Status New    Target Date 10/31/20      PT LONG TERM GOAL #3   Title pt to be able to stand / walk for >= 60 min with no AD with no report of pain or limitation.    Time 8    Period Weeks    Status New    Target Date 10/31/20      PT LONG TERM GOAL #4   Title increase FOTO score to </=37% limited to demo improvement in function    Time 8    Period Weeks    Status New    Target Date 10/31/20      PT LONG TERM GOAL #5   Title pt to be IND with all HEP to maintain and progress current LOF IND    Time 8    Period Weeks    Status New    Target Date 10/31/20                 Plan - 10/24/20 0840    Clinical Impression Statement Therapy focused on lift training and instability training today. The patient tolerated training well. He required mod cuing at foirst for proper lifting but after cuing he was able to lift the kettle bell without significant pain.Marland KitchenHe did have some pain. His FOTO score improved signifcantly. He will return to the MD in 2 weeks. If he is doing well he will likely discharge.    Stability/Clinical Decision Making Stable/Uncomplicated    Clinical Decision Making Low    Rehab Potential Good    PT Frequency Other (comment)    PT Duration 8 weeks    PT Treatment/Interventions ADLs/Self Care Home Management;Cryotherapy;Electrical Stimulation;Iontophoresis 4mg /ml Dexamethasone;Moist Heat;Ultrasound;Stair training;Gait training;Therapeutic exercise;Therapeutic activities;Balance training;Neuromuscular re-education;Manual techniques;Passive range of motion;Dry needling;Vasopneumatic Device    PT Next Visit Plan Complete FOTO next session, work on lunges and work towards kneeling on foam pad as tolerated, IASTM and soft tissue mobilization to vastus lateralis in LLE, review/ update HEP PRN, knee ROM, hi /knee strengthening. gait training. modalities PRN consider adding steps when able, functional lifting/ work  related activities.    PT Home  Exercise Plan [place] - quad set, SLR, heel slide, seated calf stretch, seated hamstring stretch,    Consulted and Agree with Plan of Care Patient           Patient will benefit from skilled therapeutic intervention in order to improve the following deficits and impairments:  Improper body mechanics, Increased muscle spasms, Postural dysfunction, Decreased activity tolerance, Decreased endurance, Pain, Decreased range of motion, Abnormal gait, Increased edema  Visit Diagnosis: Acute pain of left knee  Other abnormalities of gait and mobility  Muscle weakness (generalized)  Localized edema  PHYSICAL THERAPY DISCHARGE SUMMARY  Visits from Start of Care: 7  Current functional level related to goals / functional outcomes: Significant improvement in ability to walk and perform daily activity   Remaining deficits: Mild pain at times   Education / Equipment: HEP   Plan: Patient agrees to discharge.  Patient goals were met. Patient is being discharged due to meeting the stated rehab goals.  ?????       Problem List Patient Active Problem List   Diagnosis Date Noted  . Appendicitis 11/14/2016    Carney Living PT DPT  10/24/2020, 11:56 AM  [facility] [address] [place], [place], [address] Phone: [phone]   Fax:  [phone]  Name: William Sutton MRN: [id number] Date of Birth: [birth year]

## 2020-12-02 ENCOUNTER — Encounter: Payer: Self-pay | Admitting: Physical Therapy

## 2023-05-31 ENCOUNTER — Ambulatory Visit: Payer: Self-pay | Attending: Interventional Cardiology | Admitting: Interventional Cardiology

## 2023-05-31 ENCOUNTER — Encounter: Payer: Self-pay | Admitting: Interventional Cardiology

## 2023-06-02 ENCOUNTER — Encounter: Payer: Self-pay | Admitting: Cardiology

## 2023-06-02 ENCOUNTER — Ambulatory Visit: Payer: Self-pay | Attending: Cardiology | Admitting: Cardiology

## 2023-06-02 VITALS — BP 120/60 | HR 65 | Ht 61.0 in | Wt 187.7 lb

## 2023-06-02 DIAGNOSIS — Z79899 Other long term (current) drug therapy: Secondary | ICD-10-CM

## 2023-06-02 DIAGNOSIS — R0600 Dyspnea, unspecified: Secondary | ICD-10-CM

## 2023-06-02 DIAGNOSIS — Z794 Long term (current) use of insulin: Secondary | ICD-10-CM

## 2023-06-02 DIAGNOSIS — E781 Pure hyperglyceridemia: Secondary | ICD-10-CM

## 2023-06-02 DIAGNOSIS — R748 Abnormal levels of other serum enzymes: Secondary | ICD-10-CM

## 2023-06-02 DIAGNOSIS — E119 Type 2 diabetes mellitus without complications: Secondary | ICD-10-CM | POA: Insufficient documentation

## 2023-06-02 DIAGNOSIS — E782 Mixed hyperlipidemia: Secondary | ICD-10-CM

## 2023-06-02 MED ORDER — ROSUVASTATIN CALCIUM 20 MG PO TABS: 20.0000 mg | ORAL_TABLET | Freq: Every day | ORAL | 3 refills | Status: DC

## 2023-06-02 NOTE — Progress Notes (Signed)
Cardiology CONSULT Note    Date:  06/02/2023   ID:  Drae Mongan, DOB 07/27/1986, MRN [id number]  PCP:  Willaim Rayas, NP  Cardiologist:  Armanda Magic, MD   Chief Complaint  Patient presents with   New Patient (Initial Visit)    Hypertriglyceridemian    Patient Profile: William Sutton is a 37 y.o. male who is being seen today for the evaluation of Hyperlipidemia at the request of Willaim Rayas, NP.  History of Present Illness:  William Sutton is a 37 y.o. male who is being seen today for the evaluation of hyperlipidemia at the request of Willaim Rayas, NP.  This is a 37 year old male with a history of abdominal pain and elevated triglycerides who was referred for further evaluation of hyperlipidemia which is primarily hypertriglyceridemia.  He has had some SOB in the past but since March has not had any SOB.  He also has had some GERD.  He denies any PND, orthopnea, LE edema, dizziness, palpitations or syncope. He is compliant with His meds and is tolerating meds with no SE.    In review of labs from PCP in March [address] he had a total cholesterol of 261, HDL 32, LDL 200, TAG>1000, TSH 3.165, AST 75, ALT 163 and HbA1C 6.5.  He was started on Atorvastatin 40mg  daily but used the prescription but had no refills so has not taken it in over a month. In review of prior labs his TAGs had ranged fasting from 314-354 in setting of normal TSH but the last lab showing TAGs >1000 was non fasting. He admits to drinking ETOH and says that he only drinks 3 times yearly.   Past Medical History:  Diagnosis Date   Abdominal pain    DM2 (diabetes mellitus, type 2) (HCC)    HbA1C 6.5 in March 2024   Medial meniscus tear    left knee   Mixed hyperlipidemia     Past Surgical History:  Procedure Laterality Date   KNEE ARTHROSCOPY WITH MEDIAL MENISECTOMY Left 08/21/2020   Procedure: KNEE ARTHROSCOPY WITH PARTIAL MEDIAL MENISECTOMY;  Surgeon: Yolonda Kida, MD;   Location: [facility];  Service: Orthopedics;  Laterality: Left;  60 MINS   LAPAROSCOPIC APPENDECTOMY N/A 11/14/2016   Procedure: APPENDECTOMY LAPAROSCOPIC;  Surgeon: Abigail Miyamoto, MD;  Location: [facility];  Service: General;  Laterality: N/A;    Current Medications: Current Meds  Medication Sig   atorvastatin (LIPITOR) 40 MG tablet Take 40 mg by mouth daily.   ibuprofen (ADVIL) 800 MG tablet Take 800 mg by mouth every 8 (eight) hours as needed.   ondansetron (ZOFRAN ODT) 4 MG disintegrating tablet Take 1 tablet (4 mg total) by mouth every 8 (eight) hours as needed for nausea or vomiting.    Allergies:   Patient has no known allergies.   Social History   Socioeconomic History   Marital status: Single    Spouse name: Not on file   Number of children: Not on file   Years of education: Not on file   Highest education level: Not on file  Occupational History   Not on file  Tobacco Use   Smoking status: Some Days    Types: Cigarettes   Smokeless tobacco: Never   Tobacco comments:    occ 4 x year  Vaping Use   Vaping Use: Never used  Substance and Sexual Activity   Alcohol use: Yes    Comment: occ   Drug use: Never  Sexual activity: Not on file  Other Topics Concern   Not on file  Social History Narrative   Not on file   Social Determinants of Health   Financial Resource Strain: Not on file  Food Insecurity: Not on file  Transportation Needs: Not on file  Physical Activity: Not on file  Stress: Not on file  Social Connections: Not on file     Family History:  The patient's family history includes Kidney failure in his mother.   ROS:   Please see the history of present illness.    ROS All other systems reviewed and are negative.      No data to display             PHYSICAL EXAM:   VS:  BP 120/60   Pulse 65   Ht 5\' 1"  (1.549 m)   Wt 187 lb 11.2 oz (85.1 kg)   SpO2 97%   BMI 35.47 kg/m    GEN: Well nourished, well developed, in no  acute distress  HEENT: normal  Neck: no JVD, carotid bruits, or masses Cardiac: RRR; no murmurs, rubs, or gallops,no edema.  Intact distal pulses bilaterally.  Respiratory:  clear to auscultation bilaterally, normal work of breathing GI: soft, nontender, nondistended, + BS MS: no deformity or atrophy  Skin: warm and dry, no rash Neuro:  Alert and Oriented x 3, Strength and sensation are intact Psych: euthymic mood, full affect  Wt Readings from Last 3 Encounters:  06/02/23 187 lb 11.2 oz (85.1 kg)  08/21/20 172 lb 14.4 oz (78.4 kg)  11/14/16 161 lb 13.1 oz (73.4 kg)      Studies/Labs Reviewed:   EKG:  EKG is ordered today.  The ekg ordered today demonstrates EKG demonstrates normal sinus rhythm at 60 bpm with no ST-T wave abnormalities      Recent Labs: No results found for requested labs within last 365 days.   Lipid Panel No results found for: "CHOL", "TRIG", "HDL", "CHOLHDL", "VLDL", "LDLCALC", "LDLDIRECT"        Additional studies/ records that were reviewed today include:  none    ASSESSMENT:    1. Hypertriglyceridemia   2. Mixed hyperlipidemia      PLAN:  In order of problems listed above:  Mixed hyperlipidemia -His most recent labs from PCP in March 24 showed tChol 261, LDL 200, HDL 32, TAG>1000 (non fasting), TSH 3.165 and HbA1C 6.5% as well as elevated LFTs with AST 75 and ALT 163 -he has DM which likely is driving his hypertriglyceridemia -he was started on Atorvastatin but only took for 2 months and ran out -will change to Crestor 20mg  daily and check FLP and ALT and Lp(a) in 6 weeks -check coronary Ca score to assess future cardiac risk especially with tobacco use  2.  Elevated LFTs -ALT 163 and AST 75 prior to initiation of stating therapy -suspect he has fatty liver -minimal use of ETOH and Tylenol -will refer to GI for evaluation for fatty liver>>will go ahead and get abdominal US  3.  SOB -this seemed to be brief and has resolved -check  2D echo  4.  DM type 2 -HbA1C elevated at 6.5% and is new -I will refer him to Dr. Talmage Nap with endocrinolgy   Followup:  1 year  Medication Adjustments/Labs and Tests Ordered: Current medicines are reviewed at length with the patient today.  Concerns regarding medicines are outlined above.  Medication changes, Labs and Tests ordered today are listed in the Patient  Instructions below.  There are no Patient Instructions on file for this visit.   Signed, Armanda Magic, MD  06/02/2023 1:56 PM    [facility] [address] [place], [place], [place]  [address] Phone: [phone]; Fax: [phone]

## 2023-06-02 NOTE — Patient Instructions (Addendum)
Medication Instructions:  Please start taking crestor 20 mg daily at bedtime.   *If you need a refill on your cardiac medications before your next appointment, please call your pharmacy*   Lab Work: Please make an appointment to have a FASTING lipid panel, an ALT and a lipoprotein A lab drawn in our lab in six weeks.   If you have labs (blood work) drawn today and your tests are completely normal, you will receive your results only by: MyChart Message (if you have MyChart) OR A paper copy in the mail If you have any lab test that is abnormal or we need to change your treatment, we will call you to review the results.   Testing/Procedures: Your physician has requested that you have an echocardiogram. Echocardiography is a painless test that uses sound waves to create images of your heart. It provides your doctor with information about the size and shape of your heart and how well your heart's chambers and valves are working. This procedure takes approximately one hour. There are no restrictions for this procedure. Please do NOT wear cologne, perfume, aftershave, or lotions (deodorant is allowed). Please arrive 15 minutes prior to your appointment time.  Your physician has ordered a calcium score CT for you. This is a test that can provide information about your risk of cardiac disease. The patient cost is $99.   Your physician has also ordered an ultrasound of your liver. This is a non-invasive scan that can detect abnormalities of the liver.  Follow-Up:  At [facility], you and your health needs are our priority.  As part of our continuing mission to provide you with exceptional heart care, we have created designated Provider Care Teams.  These Care Teams include your primary Cardiologist (physician) and Advanced Practice Providers (APPs -  Physician Assistants and Nurse Practitioners) who all work together to provide you with the care you need, when you need it.  We recommend  signing up for the patient portal called "MyChart".  Sign up information is provided on this After Visit Summary.  MyChart is used to connect with patients for Virtual Visits (Telemedicine).  Patients are able to view lab/test results, encounter notes, upcoming appointments, etc.  Non-urgent messages can be sent to your provider as well.   To learn more about what you can do with MyChart, go to [email or web address].    Your next appointment:   1 year(s)  Provider:   Dr. Armanda Magic, MD   Other Instructions Dr. Mayford Knife has referred you to Dr. Talmage Nap for endocrinology work up. This is a doctor who treats diabetes. She has also referred you to Dr. Marca Ancona, a gastroenterologist. This is a doctor who can treat liver conditions.

## 2023-07-07 ENCOUNTER — Ambulatory Visit (HOSPITAL_COMMUNITY)
Admission: RE | Admit: 2023-07-07 | Discharge: 2023-07-07 | Disposition: A | Discharge status: Home / Self Care | Payer: Self-pay | Source: Ambulatory Visit | Attending: Cardiology | Admitting: Cardiology

## 2023-07-07 DIAGNOSIS — R748 Abnormal levels of other serum enzymes: Secondary | ICD-10-CM | POA: Insufficient documentation

## 2023-07-07 DIAGNOSIS — R0600 Dyspnea, unspecified: Secondary | ICD-10-CM

## 2023-07-07 DIAGNOSIS — Z79899 Other long term (current) drug therapy: Secondary | ICD-10-CM | POA: Insufficient documentation

## 2023-07-14 ENCOUNTER — Ambulatory Visit: Payer: Self-pay

## 2023-07-14 ENCOUNTER — Ambulatory Visit (HOSPITAL_BASED_OUTPATIENT_CLINIC_OR_DEPARTMENT_OTHER): Payer: Self-pay

## 2023-07-14 DIAGNOSIS — R0600 Dyspnea, unspecified: Secondary | ICD-10-CM

## 2023-07-14 DIAGNOSIS — Z79899 Other long term (current) drug therapy: Secondary | ICD-10-CM

## 2023-07-14 LAB — ECHOCARDIOGRAM COMPLETE
Area-P 1/2: 2.94 cm2
S' Lateral: 2.8 cm

## 2023-07-15 LAB — LIPOPROTEIN A (LPA)

## 2023-07-15 LAB — LIPID PANEL
Chol/HDL Ratio: 4.8 ratio (ref 0.0–5.0)
HDL: 33 mg/dL — ABNORMAL LOW (ref >39)
VLDL Cholesterol Cal: 18 mg/dL (ref 5–40)

## 2023-07-16 LAB — LIPID PANEL
Cholesterol, Total: 158 mg/dL (ref 100–199)
LDL Chol Calc (NIH): 107 mg/dL — ABNORMAL HIGH (ref 0–99)
Triglycerides: 99 mg/dL (ref 0–149)

## 2023-07-16 LAB — ALT: ALT: 139 IU/L — ABNORMAL HIGH (ref 0–44)

## 2023-07-19 ENCOUNTER — Telehealth: Payer: Self-pay

## 2023-07-19 DIAGNOSIS — Z79899 Other long term (current) drug therapy: Secondary | ICD-10-CM

## 2023-07-19 DIAGNOSIS — E785 Hyperlipidemia, unspecified: Secondary | ICD-10-CM

## 2023-07-19 DIAGNOSIS — R748 Abnormal levels of other serum enzymes: Secondary | ICD-10-CM

## 2023-07-19 DIAGNOSIS — K760 Fatty (change of) liver, not elsewhere classified: Secondary | ICD-10-CM

## 2023-07-19 NOTE — Telephone Encounter (Signed)
Using translator # [id number] called to advise patient of several test results.   Advised patient that Echo showed normal EF with mildly thickened heart muscle and trivial leakiness of AV. Patient verbalizing understanding that he is unlikely to have symptoms from this at this time but we may need to repeat Echo over time.  Advised patient that cardiac calcium score CT showed no coronary calcium, patient verbalizes understanding that this means he has low risk for blockages in coronary arteries at this time.  Reviewed lipid panel, elevated lipid levels, Dr. Mayford Knife recommends referral to lipid clinic. Patient agrees to plan, referral placed.  Advised that liver enzymes were elevated, which could be caused by cholesterol lowering medication. Also, liver appeared fatty on abd [place], which may be related to liver disease, diet or medications. Seeing a GI doctor would help to determine this. Patient verbalizes understanding and agrees to referral. Orders placed.

## 2023-07-19 NOTE — Telephone Encounter (Signed)
-----   Message from Armanda Magic sent at 07/07/2023 11:39 AM EDT ----- US shows fatty liver and likely the etiology of elevated LFTs.  Please make sure he has been referred to GI

## 2023-08-18 ENCOUNTER — Ambulatory Visit: Payer: Self-pay | Attending: Cardiovascular Disease | Admitting: Pharmacist

## 2023-08-18 DIAGNOSIS — E781 Pure hyperglyceridemia: Secondary | ICD-10-CM

## 2023-08-18 DIAGNOSIS — K760 Fatty (change of) liver, not elsewhere classified: Secondary | ICD-10-CM

## 2023-08-18 DIAGNOSIS — R748 Abnormal levels of other serum enzymes: Secondary | ICD-10-CM

## 2023-08-18 NOTE — Patient Instructions (Addendum)
Es muy importante que realice un seguimiento con gastroenterologa (mdicos que pueden ayudar a Medical sales representative hgado). Ellos deberan llamarlo para programar una cita. Si no recibe noticias de ellos en la prxima semana, llmelos al [phone].  Hay una oficina de inscripcin a Medicaid en [employer] [place] ([address] [place], [place], [place], [address], [address]). La oficina est en el cuarto piso del edificio. Ellos podrn ayudarlo a determinar si es elegible para Medicaid. Esto ser muy til para ayudar a cubrir el costo de las visitas al mdico y [employer].  Consejos para el xito: Comience de a poco y Photographer. Tenga en cuenta que lleva tiempo alcanzar las metas y los pequeos pasos se suman.  Cualquier movimiento adicional ayuda a Art gallery manager. Tomar las Microbiologist del ascensor y Scientist, product/process development en el extremo ms alejado de su estacionamiento son formas fciles de Games developer. Caminar a paso ligero durante al menos 30 minutos 4 o ms das a la semana es una excelente meta a la que aspirar  Entender qu significa sentirse lleno: Saba que su cerebro puede tardar 15 minutos o ms en recibir el mensaje de que ha comido? Eso significa que, si come menos comida, pero la consume ms lentamente, puede sentirse satisfecho.  Comer muchas frutas y verduras tambin puede ayudarle a sentirse ms lleno.  Coma en platos ms pequeos para que las porciones moderadas no parezcan demasiado pequeas  Consejos para llevar una vida ms saludable  Cmo crear una dieta sana y equilibrada La mayor parte de sus comidas debe estar compuesta por verduras y frutas (la mitad de su plato). Intente que haya color y variedad, y recuerde que las patatas no cuentan como verduras en el Plato de Alimentacin Saludable debido a su impacto negativo en el Production assistant, radio.  Elija cereales integrales ( de su plato). Los cereales integrales e intactos (trigo integral, [place], granos de [place],  [place], arroz integral y alimentos elaborados con ellos, como la pasta integral) tienen un efecto ms leve sobre Nature conservation officer y la insulina que el pan [place], el arroz blanco y otros cereales refinados.  El poder de las protenas:  de su plato. El pescado, las aves, los frijoles y los frutos secos son fuentes de protenas saludables y verstiles: se Lawyer en ensaladas y combinan bien con las verduras en un plato. Limite la carne roja y evite las carnes procesadas, como el tocino y las salchichas.  Aceites vegetales saludables: con moderacin. Elija aceites vegetales saludables como el de [place], canola, soja, maz, [place], man y [place], y [employer] aceites parcialmente hidrogenados, que contienen grasas trans no saludables. Recuerde que bajo en grasas no significa "saludable".  Beba agua, caf o t. Evite las bebidas azucaradas, limite la [place] y los productos lcteos a una o dos porciones por da y limite el jugo a un vaso pequeo por da.  Mantngase activo. La figura roja que recorre el mantel del Plato para una alimentacin saludable es un recordatorio de que mantenerse activo tambin es importante para Art gallery manager.  El mensaje principal del Plato para una alimentacin saludable es centrarse en la calidad de la dieta:  El tipo de carbohidratos en la dieta es ms importante que la cantidad de carbohidratos en la dieta, porque algunas fuentes de carbohidratos, como las verduras (excepto las papas), las frutas, los cereales integrales y las legumbres, son ms saludables que [place]. El Plato para una alimentacin saludable tambin aconseja a los Animator  las bebidas azucaradas, una fuente importante de caloras (generalmente con poco valor nutricional) en la dieta estadounidense. El Plato para una alimentacin saludable [place] a los consumidores a Naval architect saludables y no establece un mximo en el porcentaje de caloras que las personas deben obtener  cada da de fuentes saludables de [place]. De esta [place], el Plato para una alimentacin saludable recomienda lo opuesto al mensaje de bajo contenido en grasas promovido durante dcadas por Insurance underwriter.  [email or web address]  [employer]  El azcar es un gran problema en la dieta moderna. El azcar es un importante factor que contribuye a las enfermedades cardacas, la diabetes, los niveles altos de triglicridos, la enfermedad del hgado graso y la obesidad. El azcar est oculto en casi todos los alimentos y bebidas envasados. El azcar aadido es azcar adicional que se aade a la cantidad que se encuentra de forma natural y que no aporta ningn beneficio nutricional al organismo. La Asociacin Estadounidense del [place] recomienda limitar los azcares aadidos a no ms de 25 g para las mujeres y 36 g para los hombres por Futures trader. El azcar tiene muchos nombres, como maltosa, sacarosa (nombres que terminan en "osa"), jarabe de maz con alto contenido de fructosa, melaza, azcar de caa, edulcorante de maz, azcar sin refinar, jarabe, miel o concentrado de jugo de frutas.  Una de las mejores formas de [employer] azcares aadidos es dejar de beber bebidas endulzadas como refrescos, t [place] y [place] de frutas.  Hay 65 g de azcares aadidos en una botella de 20 oz de [employer]! Eso equivale a 7,5 donas.  Preste atencin y lea todas las etiquetas de informacin nutricional. A continuacin, se muestran ejemplos de etiquetas de informacin nutricional. El nmero 1 muestra los azcares totales, mientras que el nmero 2 muestra los azcares aadidos. Esta porcin tiene casi la cantidad mxima de azcares aadidos por da!  EJERCICIO  Hacer ejercicio es bueno. Todos hemos odo eso. En un mundo ideal, todos tendramos tiempo y recursos para Environmental manager. Cuando ests [place], tu corazn bombea con mayor eficiencia y te Scientist, research (physical sciences). Mltiples estudios muestran  que incluso caminar regularmente tiene beneficios que yo

## 2023-08-18 NOTE — Progress Notes (Signed)
Patient ID: William Sutton                 DOB: [birth year]                    MRN: [id number]     HPI: William Sutton is a 37 y.o. male patient referred to lipid clinic by Dr. Mayford Knife. PMH is significant for HLD, T2DM, coronary Ca score of 0 in July 2024. Per Dr. Norris Cross note, lipid panel from PCP visit in March showed TC 261, HDL 32, LDL not able to be calculated, TG >1000 (non-fasting). Although has had prior TG ranging 314-354. Other labs from March include TSH 3.165, AST 75, ALT 163 and HbA1C 6.5. He was started on atorvastatin 40 mg in March by PCP but had no refills so he had not taken it in over a month when he saw Dr. Mayford Knife on 06/02/23. He was switched to rosuvastatin 20 mg daily and referred to GI for evaluation of fatty liver. AST and ALT were elevated prior to starting statin therapy and patient reported minimal Tylenol and alcohol use. On recheck in July LDL was improved to 107, TG 99 and Lp(a) was not elevated. ALT remained elevated at 139. Referred to pharmacy for lipid management.  Patient reports to clinic today. Discontinued rosuvastatin a few weeks ago due to fatigue. Felt like it was causing drowsiness during the day while driving, although this has continued since stopping the statin. Reports that he stopped rosuvastatin before last lipid panel in July. Has not heard from gastroenterology to schedule an appointment referral was closed out. 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 Currently drinking about 5 beers per week, sometimes daily. Denies Tylenol use. Has been trying to cut back on soda lately. Has only had 1 soda this week but previously was having 4-5 sodas per week. Drinking 3 cups of coffee with sugar daily. Asked about diabetes diagnosis today. Reports he does not have health insurance and has not ever applied for Medicaid.  Current Medications: none Intolerances: rosuvastatin (drowsiness) Risk Factors: T2DM,  HLD LDL goal: <100 mg/dl  Diet:  Breakfast: salad with carrots, cucumber, boiled eggs Lunch: rice, beans, meat, 4-5 tortillas Dinner: salad, fruit Snacks: cookies, fruit Drinks: working on cutting back on soda, 3 cups of coffee per day with sugar, water  Exercise: no physical activity  Family History: mother- kidney failure  Social History: 5 beers per week  Labs: 07/14/23- TC 158, TG 99, HDL 33, LDL 107 (no cholesterol meds) March 2024- TC 261, HDL 32, LDL 200, TG >1000 (non-fasting) Past Medical History:  Diagnosis Date   Abdominal pain    DM2 (diabetes mellitus, type 2) (HCC)    HbA1C 6.5 in March 2024   Medial meniscus tear    left knee   Mixed hyperlipidemia     Current Outpatient Medications on File Prior to Visit  Medication Sig Dispense Refill   ibuprofen (ADVIL) 800 MG tablet Take 800 mg by mouth every 8 (eight) hours as needed.     ondansetron (ZOFRAN ODT) 4 MG disintegrating tablet Take 1 tablet (4 mg total) by mouth every 8 (eight) hours as needed for nausea or vomiting. 20 tablet 0   rosuvastatin (CRESTOR) 20 MG tablet Take 1 tablet (20 mg total) by mouth daily. 90 tablet 3   No current facility-administered medications on file prior to visit.    No Known Allergies  Assessment/Plan:  1. Hyperlipidemia -  Uncontrolled, but close to goal based on last lipid panel LDL 107, goal LDL<100 mg/dl. Will not restart rosuvastatin given his LDL is close to goal without medications and LFTs are elevated. Although, LFT elevation unlikely to be due to rosuvastatin as they were elevated prior to initiating statin. He was not aware of most recent A1c 6.5% indicating diabetes. Stressed the importance of lifestyle changes to improve glycemic control and lower cholesterol. Discussed cutting back on sodas, not adding sugar to coffee, and limiting portions of tortillas and rice. Also discussed adding more protein to breakfast/dinner meals in order to avoid snacking with cookies.  Encouraged him to stop drinking alcohol as well. Discussed incorporating physical activity into his schedule. Can start small with walking for 30 minutes 1-2 days of the week with the goal of increasing to 30 minutes for 5 days a week. Provided him with healthy lifestyle resources. Discussed reason for GI referral and expressed the importance of following up with them. Referral to [facility] from last visit had been closed. Replaced referral to [facility] for elevated LFTs and  and provided pt with phone number to call and schedule appt if he does not hear from them. Also let patient know he can go to [facility] enrollment office to see if he is eligible for Medicaid and provided him with address/instructions. Will follow up with patient after he is evaluated by GI.  Adam Phenix, PharmD PGY-1 Pharmacy Resident  Olene Floss, Pharm.D, BCACP, BCPS, CPP [facility] [address], [place], [place] [address]  Phone: [phone]; Fax: [phone]

## 2024-05-29 ENCOUNTER — Ambulatory Visit: Payer: Self-pay | Admitting: Cardiology

## 2024-05-30 ENCOUNTER — Ambulatory Visit: Payer: Self-pay | Attending: Cardiology | Admitting: Cardiology

## 2024-05-31 ENCOUNTER — Encounter: Payer: Self-pay | Admitting: Cardiology

## 2024-06-14 ENCOUNTER — Ambulatory Visit: Payer: Self-pay | Attending: Cardiology | Admitting: Cardiology

## 2024-06-15 ENCOUNTER — Encounter: Payer: Self-pay | Admitting: Cardiology
